# Patient Record
Sex: Female | Born: 1965 | Race: Black or African American | Hispanic: No | Marital: Single | Smoking: Current every day smoker
Health system: Southern US, Community
[De-identification: ages and names within clinical notes are randomized; demographics above are authoritative.]

---

## 1998-07-22 ENCOUNTER — Other Ambulatory Visit: Admission: RE | Admit: 1998-07-22 | Discharge: 1998-07-22 | Payer: Self-pay | Admitting: Gynecology

## 2003-11-07 ENCOUNTER — Inpatient Hospital Stay (HOSPITAL_COMMUNITY): Admission: AD | Admit: 2003-11-07 | Discharge: 2003-11-07 | Payer: Self-pay | Admitting: Obstetrics & Gynecology

## 2003-11-09 ENCOUNTER — Inpatient Hospital Stay (HOSPITAL_COMMUNITY): Admission: AD | Admit: 2003-11-09 | Discharge: 2003-11-09 | Payer: Self-pay | Admitting: Family Medicine

## 2003-11-11 ENCOUNTER — Other Ambulatory Visit: Admission: RE | Admit: 2003-11-11 | Discharge: 2003-11-11 | Payer: Self-pay | Admitting: Obstetrics and Gynecology

## 2003-11-17 ENCOUNTER — Ambulatory Visit (HOSPITAL_COMMUNITY): Admission: RE | Admit: 2003-11-17 | Discharge: 2003-11-17 | Payer: Self-pay | Admitting: *Deleted

## 2003-11-28 ENCOUNTER — Inpatient Hospital Stay (HOSPITAL_COMMUNITY): Admission: AD | Admit: 2003-11-28 | Discharge: 2003-11-28 | Payer: Self-pay | Admitting: Obstetrics & Gynecology

## 2003-12-01 ENCOUNTER — Encounter (INDEPENDENT_AMBULATORY_CARE_PROVIDER_SITE_OTHER): Payer: Self-pay | Admitting: *Deleted

## 2003-12-01 ENCOUNTER — Ambulatory Visit (HOSPITAL_COMMUNITY): Admission: AD | Admit: 2003-12-01 | Discharge: 2003-12-01 | Payer: Self-pay | Admitting: Obstetrics & Gynecology

## 2004-01-15 ENCOUNTER — Ambulatory Visit: Payer: Self-pay | Admitting: Psychiatry

## 2004-01-15 ENCOUNTER — Inpatient Hospital Stay (HOSPITAL_COMMUNITY): Admission: RE | Admit: 2004-01-15 | Discharge: 2004-01-20 | Payer: Self-pay | Admitting: Psychiatry

## 2004-04-08 ENCOUNTER — Inpatient Hospital Stay (HOSPITAL_COMMUNITY): Admission: AD | Admit: 2004-04-08 | Discharge: 2004-04-08 | Payer: Self-pay | Admitting: Obstetrics & Gynecology

## 2004-06-21 ENCOUNTER — Inpatient Hospital Stay (HOSPITAL_COMMUNITY): Admission: AD | Admit: 2004-06-21 | Discharge: 2004-06-21 | Payer: Self-pay | Admitting: Obstetrics & Gynecology

## 2004-08-08 ENCOUNTER — Inpatient Hospital Stay (HOSPITAL_COMMUNITY): Admission: AD | Admit: 2004-08-08 | Discharge: 2004-08-08 | Payer: Self-pay | Admitting: Obstetrics & Gynecology

## 2004-09-12 ENCOUNTER — Inpatient Hospital Stay (HOSPITAL_COMMUNITY): Admission: AD | Admit: 2004-09-12 | Discharge: 2004-09-12 | Payer: Self-pay | Admitting: Obstetrics

## 2004-09-29 ENCOUNTER — Inpatient Hospital Stay (HOSPITAL_COMMUNITY): Admission: AD | Admit: 2004-09-29 | Discharge: 2004-10-02 | Payer: Self-pay | Admitting: Obstetrics

## 2004-09-30 ENCOUNTER — Encounter (INDEPENDENT_AMBULATORY_CARE_PROVIDER_SITE_OTHER): Payer: Self-pay | Admitting: Specialist

## 2004-11-17 ENCOUNTER — Ambulatory Visit (HOSPITAL_COMMUNITY): Admission: RE | Admit: 2004-11-17 | Discharge: 2004-11-17 | Payer: Self-pay | Admitting: Obstetrics & Gynecology

## 2005-04-30 IMAGING — US US OB TRANSVAGINAL
1 series · 16 of 16 positions shown · non-contrast
Comparison: none

CLINICAL DATA: Reassess for viability.
TRANSVAGINAL OBSTETRICAL ULTRASOUND:
Correlation is made with the previous exam dated 11/07/03.
Multiple images of the uterus and adnexa were obtained using an endovaginal approach.
There is a single intrauterine pregnancy identified that demonstrates an estimated gestational age by crown rump length of 7 weeks and 3 days.  No evidence for fetal cardiac activity was discerned by two independent observers for a total period of 3 minutes and should be easily recognizable at this point in gestation.  Findings are confirmatory of an intrauterine fetal demise. 
The left ovary has a normal appearance measuring 2.9 x 1.0 x 1.4 cm.  The right ovary contains a complex cyst with diffuse high attenuation material and demonstrating posterior acoustical shadowing measuring 6.7 x 5.8 x 5.9 cm.  This is contiguous with more normal appearing right ovarian tissue and would be compatible with an ovarian dermoid.  No pelvic fluid is seen.
IMPRESSION
Findings compatible with a 7 week 3 days intrauterine fetal demise.  Normal left ovary.  Right ovarian dermoid.

[Series 1: us ob transvaginal · 16 of 16 slices shown]
[im 1/16]
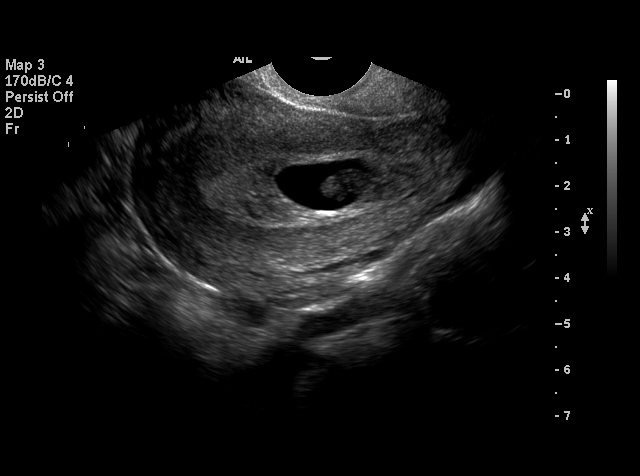
[im 2/16]
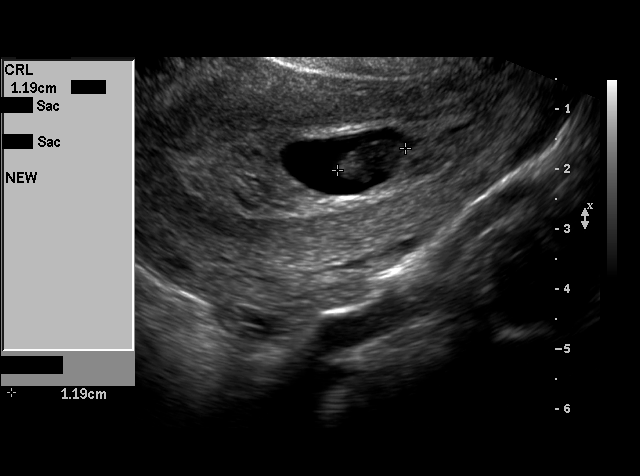
[im 3/16]
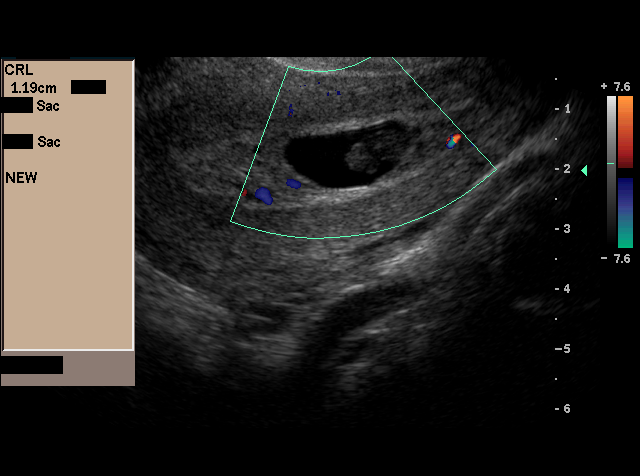
[im 4/16]
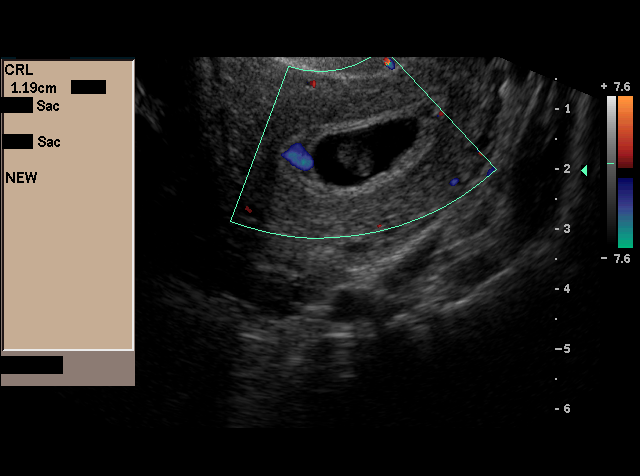
[im 5/16]
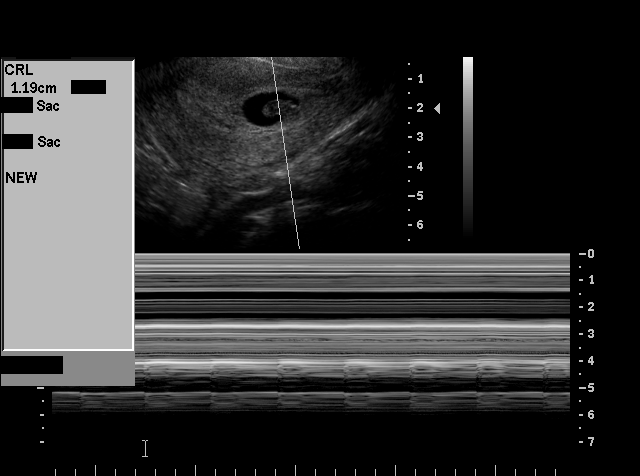
[im 6/16]
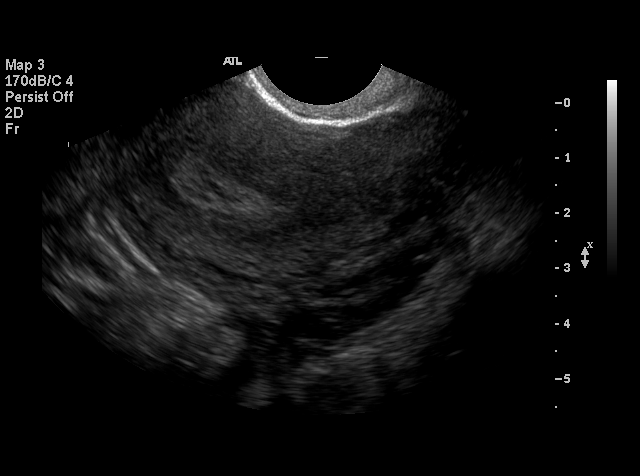
[im 7/16]
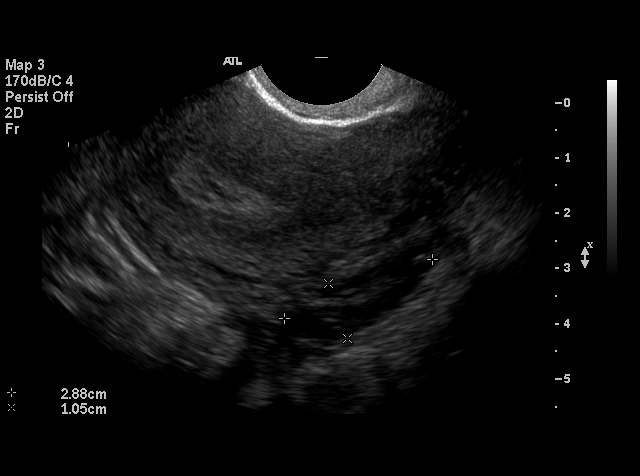
[im 8/16]
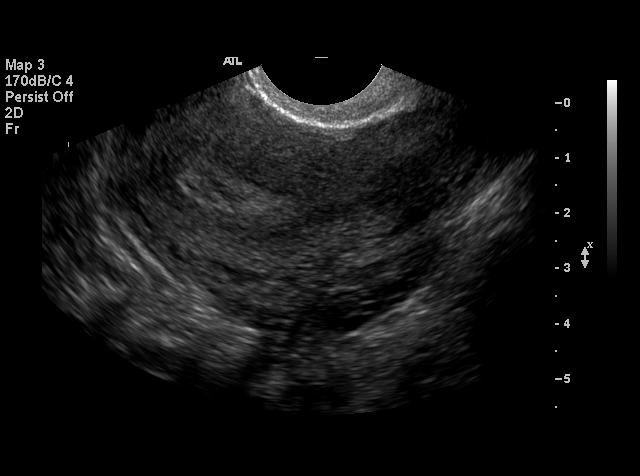
[im 9/16]
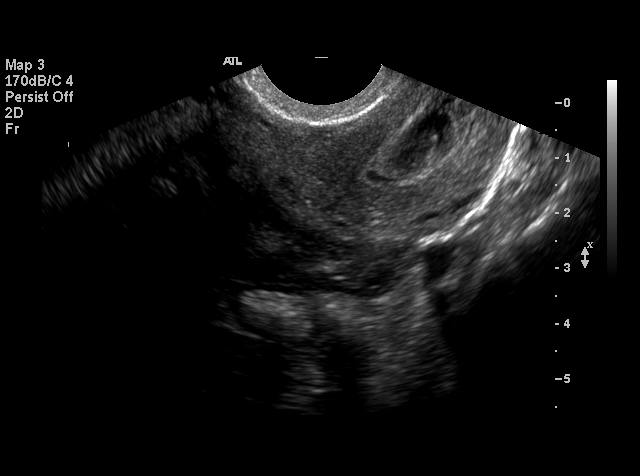
[im 10/16]
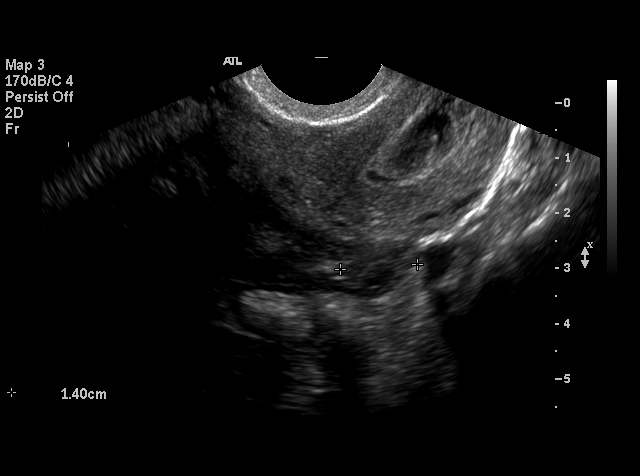
[im 11/16]
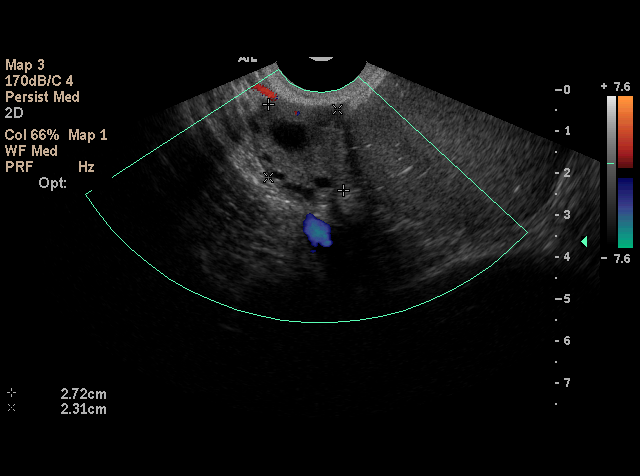
[im 12/16]
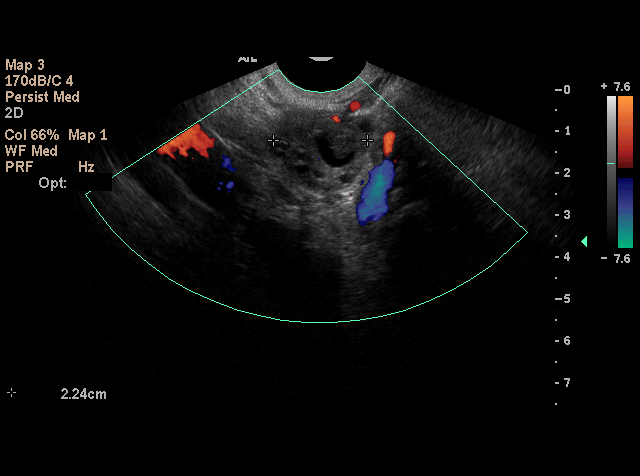
[im 13/16]
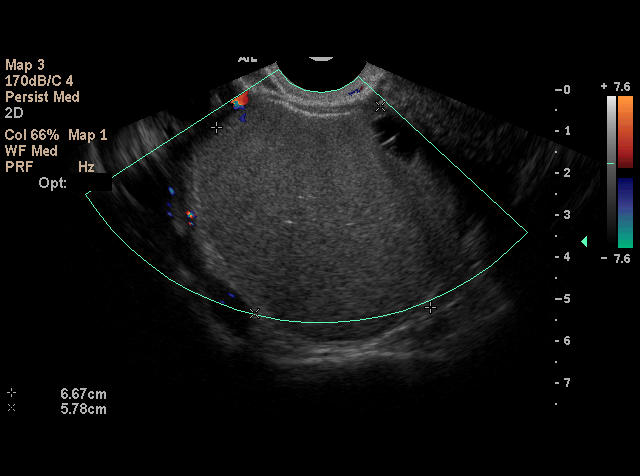
[im 14/16]
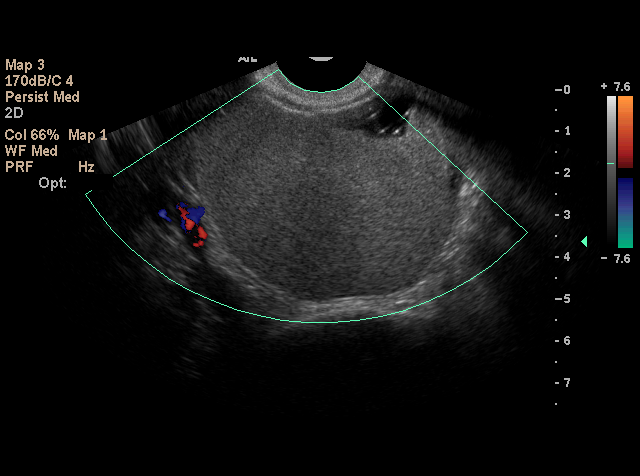
[im 15/16]
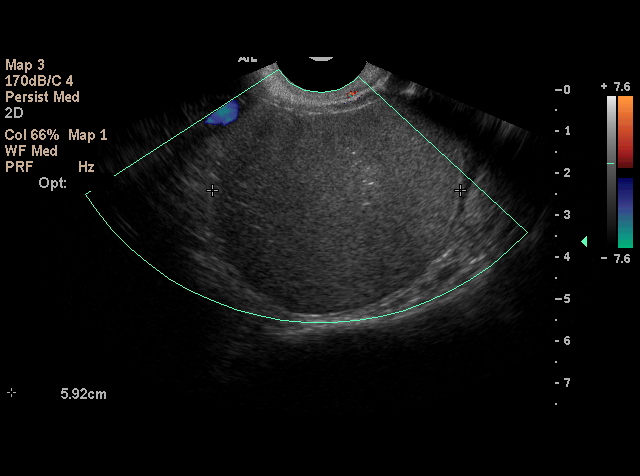
[im 16/16]
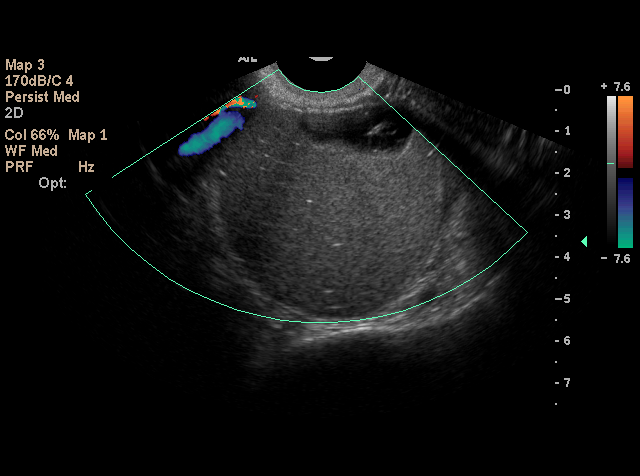

[16 of 16 positions shown; findings below may reference images not displayed]

## 2005-09-28 ENCOUNTER — Inpatient Hospital Stay (HOSPITAL_COMMUNITY): Admission: AD | Admit: 2005-09-28 | Discharge: 2005-09-28 | Payer: Self-pay | Admitting: Gynecology

## 2006-01-11 ENCOUNTER — Inpatient Hospital Stay (HOSPITAL_COMMUNITY): Admission: AD | Admit: 2006-01-11 | Discharge: 2006-01-11 | Payer: Self-pay | Admitting: Obstetrics and Gynecology

## 2006-04-27 ENCOUNTER — Inpatient Hospital Stay (HOSPITAL_COMMUNITY): Admission: AD | Admit: 2006-04-27 | Discharge: 2006-04-27 | Payer: Self-pay | Admitting: Obstetrics and Gynecology

## 2006-05-11 ENCOUNTER — Inpatient Hospital Stay (HOSPITAL_COMMUNITY): Admission: AD | Admit: 2006-05-11 | Discharge: 2006-05-11 | Payer: Self-pay | Admitting: Obstetrics & Gynecology

## 2006-05-17 ENCOUNTER — Inpatient Hospital Stay (HOSPITAL_COMMUNITY): Admission: AD | Admit: 2006-05-17 | Discharge: 2006-05-17 | Payer: Self-pay | Admitting: Obstetrics and Gynecology

## 2006-05-23 ENCOUNTER — Inpatient Hospital Stay (HOSPITAL_COMMUNITY): Admission: AD | Admit: 2006-05-23 | Discharge: 2006-05-26 | Payer: Self-pay | Admitting: Obstetrics and Gynecology

## 2006-05-23 ENCOUNTER — Encounter (INDEPENDENT_AMBULATORY_CARE_PROVIDER_SITE_OTHER): Payer: Self-pay | Admitting: Specialist

## 2007-07-26 ENCOUNTER — Encounter: Admission: RE | Admit: 2007-07-26 | Discharge: 2007-07-26 | Payer: Self-pay | Admitting: Nephrology

## 2007-11-21 ENCOUNTER — Inpatient Hospital Stay (HOSPITAL_COMMUNITY): Admission: AD | Admit: 2007-11-21 | Discharge: 2007-11-21 | Payer: Self-pay | Admitting: Obstetrics and Gynecology

## 2008-07-05 ENCOUNTER — Other Ambulatory Visit: Payer: Self-pay | Admitting: Emergency Medicine

## 2008-07-06 ENCOUNTER — Inpatient Hospital Stay (HOSPITAL_COMMUNITY): Admission: AD | Admit: 2008-07-06 | Discharge: 2008-07-08 | Payer: Self-pay | Admitting: *Deleted

## 2008-07-06 ENCOUNTER — Ambulatory Visit: Payer: Self-pay | Admitting: *Deleted

## 2009-07-06 ENCOUNTER — Inpatient Hospital Stay (HOSPITAL_COMMUNITY): Admission: RE | Admit: 2009-07-06 | Discharge: 2009-07-09 | Payer: Self-pay | Admitting: Psychiatry

## 2009-07-06 ENCOUNTER — Other Ambulatory Visit: Payer: Self-pay | Admitting: Emergency Medicine

## 2009-07-06 ENCOUNTER — Ambulatory Visit: Payer: Self-pay | Admitting: Psychiatry

## 2009-12-23 ENCOUNTER — Inpatient Hospital Stay (HOSPITAL_COMMUNITY): Admission: AD | Admit: 2009-12-23 | Discharge: 2009-12-23 | Payer: Self-pay | Admitting: Obstetrics and Gynecology

## 2009-12-23 DIAGNOSIS — R1032 Left lower quadrant pain: Secondary | ICD-10-CM

## 2009-12-23 DIAGNOSIS — O9989 Other specified diseases and conditions complicating pregnancy, childbirth and the puerperium: Secondary | ICD-10-CM

## 2009-12-23 DIAGNOSIS — O99891 Other specified diseases and conditions complicating pregnancy: Secondary | ICD-10-CM

## 2010-02-01 ENCOUNTER — Ambulatory Visit (HOSPITAL_COMMUNITY): Admission: RE | Admit: 2010-02-01 | Discharge: 2010-02-01 | Payer: Self-pay | Admitting: Obstetrics and Gynecology

## 2010-03-08 ENCOUNTER — Ambulatory Visit (HOSPITAL_COMMUNITY): Admission: RE | Admit: 2010-03-08 | Discharge: 2010-03-08 | Payer: Self-pay | Admitting: Obstetrics and Gynecology

## 2010-03-18 ENCOUNTER — Inpatient Hospital Stay (HOSPITAL_COMMUNITY): Admission: AD | Admit: 2010-03-18 | Discharge: 2010-03-18 | Payer: Self-pay | Admitting: Obstetrics & Gynecology

## 2010-04-05 ENCOUNTER — Ambulatory Visit (HOSPITAL_COMMUNITY)
Admission: RE | Admit: 2010-04-05 | Discharge: 2010-04-05 | Payer: Self-pay | Source: Home / Self Care | Attending: Obstetrics and Gynecology | Admitting: Obstetrics and Gynecology

## 2010-04-27 ENCOUNTER — Inpatient Hospital Stay (HOSPITAL_COMMUNITY)
Admission: AD | Admit: 2010-04-27 | Discharge: 2010-04-27 | Payer: Self-pay | Source: Home / Self Care | Attending: Obstetrics and Gynecology | Admitting: Obstetrics and Gynecology

## 2010-07-06 LAB — URINALYSIS, ROUTINE W REFLEX MICROSCOPIC
Bilirubin Urine: NEGATIVE
Glucose, UA: NEGATIVE mg/dL
Ketones, ur: NEGATIVE mg/dL
Specific Gravity, Urine: >1.03 — ABNORMAL HIGH (ref 1.005–1.030)
pH: 6 (ref 5.0–8.0)

## 2010-07-06 LAB — URINE MICROSCOPIC-ADD ON

## 2010-07-08 LAB — URINALYSIS, ROUTINE W REFLEX MICROSCOPIC
Ketones, ur: NEGATIVE mg/dL
Nitrite: NEGATIVE
Specific Gravity, Urine: 1.01 (ref 1.005–1.030)
Urobilinogen, UA: 0.2 mg/dL (ref 0.0–1.0)
pH: 6.5 (ref 5.0–8.0)

## 2010-07-08 LAB — WET PREP, GENITAL
Trich, Wet Prep: NONE SEEN
Yeast Wet Prep HPF POC: NONE SEEN

## 2010-07-08 LAB — GC/CHLAMYDIA PROBE AMP, GENITAL: Chlamydia, DNA Probe: NEGATIVE

## 2010-07-19 LAB — CBC
HCT: 39.6 % (ref 36.0–46.0)
Hemoglobin: 13.3 g/dL (ref 12.0–15.0)
MCHC: 33.5 g/dL (ref 30.0–36.0)
MCV: 87.8 fL (ref 78.0–100.0)
Platelets: 264 K/uL (ref 150–400)
RBC: 4.51 MIL/uL (ref 3.87–5.11)
RDW: 14.8 % (ref 11.5–15.5)
WBC: 14.7 K/uL — ABNORMAL HIGH (ref 4.0–10.5)

## 2010-07-19 LAB — ETHANOL: Alcohol, Ethyl (B): <5 mg/dL (ref 0–10)

## 2010-07-19 LAB — RAPID URINE DRUG SCREEN, HOSP PERFORMED
Amphetamines: NOT DETECTED
Barbiturates: NOT DETECTED
Benzodiazepines: NOT DETECTED
Cocaine: NOT DETECTED
Opiates: NOT DETECTED
Tetrahydrocannabinol: POSITIVE — AB

## 2010-07-19 LAB — DIFFERENTIAL
Basophils Absolute: 0 10*3/uL (ref 0.0–0.1)
Basophils Relative: 0 % (ref 0–1)
Eosinophils Absolute: 0 10*3/uL (ref 0.0–0.7)
Eosinophils Relative: 0 % (ref 0–5)
Lymphocytes Relative: 9 % — ABNORMAL LOW (ref 12–46)
Lymphs Abs: 1.3 K/uL (ref 0.7–4.0)
Monocytes Absolute: 0.8 10*3/uL (ref 0.1–1.0)
Monocytes Relative: 5 % (ref 3–12)
Neutro Abs: 12.6 K/uL — ABNORMAL HIGH (ref 1.7–7.7)
Neutrophils Relative %: 86 % — ABNORMAL HIGH (ref 43–77)

## 2010-07-19 LAB — BASIC METABOLIC PANEL WITH GFR
BUN: 3 mg/dL — ABNORMAL LOW (ref 6–23)
Creatinine, Ser: 0.81 mg/dL (ref 0.4–1.2)
GFR calc non Af Amer: >60 mL/min (ref >60)

## 2010-07-19 LAB — BASIC METABOLIC PANEL
CO2: 22 mEq/L (ref 19–32)
Calcium: 9.2 mg/dL (ref 8.4–10.5)
Chloride: 108 mEq/L (ref 96–112)
GFR calc Af Amer: >60 mL/min (ref >60)
Glucose, Bld: 102 mg/dL — ABNORMAL HIGH (ref 70–99)
Potassium: 3.1 mEq/L — ABNORMAL LOW (ref 3.5–5.1)
Sodium: 139 mEq/L (ref 135–145)

## 2010-07-19 LAB — TRICYCLICS SCREEN, URINE: TCA Scrn: NOT DETECTED

## 2010-07-23 ENCOUNTER — Inpatient Hospital Stay (HOSPITAL_COMMUNITY)
Admission: AD | Admit: 2010-07-23 | Discharge: 2010-07-23 | Disposition: A | Discharge status: Home / Self Care | Payer: Medicare Other | Source: Ambulatory Visit | Attending: Obstetrics and Gynecology | Admitting: Obstetrics and Gynecology

## 2010-07-23 DIAGNOSIS — O99891 Other specified diseases and conditions complicating pregnancy: Secondary | ICD-10-CM | POA: Insufficient documentation

## 2010-07-23 DIAGNOSIS — R109 Unspecified abdominal pain: Secondary | ICD-10-CM | POA: Insufficient documentation

## 2010-07-23 DIAGNOSIS — O9989 Other specified diseases and conditions complicating pregnancy, childbirth and the puerperium: Secondary | ICD-10-CM

## 2010-07-28 ENCOUNTER — Encounter (HOSPITAL_COMMUNITY)
Admission: RE | Admit: 2010-07-28 | Discharge: 2010-07-28 | Disposition: A | Discharge status: Home / Self Care | Payer: Medicare Other | Source: Ambulatory Visit | Attending: Obstetrics and Gynecology | Admitting: Obstetrics and Gynecology

## 2010-07-28 ENCOUNTER — Inpatient Hospital Stay (HOSPITAL_COMMUNITY)
Admission: AD | Admit: 2010-07-28 | Discharge: 2010-07-28 | Disposition: A | Discharge status: Home / Self Care | Payer: Medicare Other | Source: Ambulatory Visit | Attending: Obstetrics and Gynecology | Admitting: Obstetrics and Gynecology

## 2010-07-28 ENCOUNTER — Inpatient Hospital Stay (HOSPITAL_COMMUNITY)
Admission: AD | Admit: 2010-07-28 | Discharge: 2010-07-29 | Disposition: A | Discharge status: Home / Self Care | Payer: Medicare Other | Source: Ambulatory Visit | Attending: Obstetrics and Gynecology | Admitting: Obstetrics and Gynecology

## 2010-07-28 DIAGNOSIS — Z331 Pregnant state, incidental: Secondary | ICD-10-CM

## 2010-07-28 DIAGNOSIS — O99891 Other specified diseases and conditions complicating pregnancy: Secondary | ICD-10-CM | POA: Insufficient documentation

## 2010-07-28 DIAGNOSIS — R7989 Other specified abnormal findings of blood chemistry: Secondary | ICD-10-CM

## 2010-07-28 DIAGNOSIS — R51 Headache: Secondary | ICD-10-CM | POA: Insufficient documentation

## 2010-07-28 LAB — CBC
Platelets: 259 10*3/uL (ref 150–400)
RDW: 15.6 % — ABNORMAL HIGH (ref 11.5–15.5)
WBC: 11.1 10*3/uL — ABNORMAL HIGH (ref 4.0–10.5)

## 2010-07-28 LAB — URINALYSIS, ROUTINE W REFLEX MICROSCOPIC
Ketones, ur: NEGATIVE mg/dL
Nitrite: NEGATIVE
Specific Gravity, Urine: >1.03 — ABNORMAL HIGH (ref 1.005–1.030)
Urobilinogen, UA: 0.2 mg/dL (ref 0.0–1.0)
pH: 6 (ref 5.0–8.0)

## 2010-07-28 LAB — COMPREHENSIVE METABOLIC PANEL
ALT: 35 U/L (ref 0–35)
Albumin: 2.5 g/dL — ABNORMAL LOW (ref 3.5–5.2)
Alkaline Phosphatase: 98 U/L (ref 39–117)
Calcium: 9.3 mg/dL (ref 8.4–10.5)
GFR calc Af Amer: >60 mL/min (ref >60)
Potassium: 4.5 mEq/L (ref 3.5–5.1)
Sodium: 133 mEq/L — ABNORMAL LOW (ref 135–145)
Total Protein: 5.9 g/dL — ABNORMAL LOW (ref 6.0–8.3)

## 2010-07-28 LAB — DIFFERENTIAL
Basophils Absolute: 0 10*3/uL (ref 0.0–0.1)
Basophils Relative: 0 % (ref 0–1)
Eosinophils Absolute: 0.1 10*3/uL (ref 0.0–0.7)
Eosinophils Relative: 0 % (ref 0–5)
Lymphocytes Relative: 23 % (ref 12–46)

## 2010-07-28 LAB — SURGICAL PCR SCREEN
MRSA, PCR: NEGATIVE
Staphylococcus aureus: NEGATIVE

## 2010-07-28 LAB — PROTIME-INR: INR: 0.93 (ref 0.00–1.49)

## 2010-07-28 LAB — APTT: aPTT: 29 seconds (ref 24–37)

## 2010-07-29 ENCOUNTER — Inpatient Hospital Stay (HOSPITAL_COMMUNITY)
Admission: AD | Admit: 2010-07-29 | Discharge: 2010-07-29 | Disposition: A | Discharge status: Home / Self Care | Payer: Medicare Other | Source: Ambulatory Visit | Attending: Obstetrics and Gynecology | Admitting: Obstetrics and Gynecology

## 2010-07-29 DIAGNOSIS — O99810 Abnormal glucose complicating pregnancy: Secondary | ICD-10-CM | POA: Insufficient documentation

## 2010-07-29 LAB — URINALYSIS, ROUTINE W REFLEX MICROSCOPIC
Glucose, UA: NEGATIVE mg/dL
Hgb urine dipstick: NEGATIVE
Protein, ur: NEGATIVE mg/dL
pH: 6 (ref 5.0–8.0)

## 2010-07-29 LAB — COMPREHENSIVE METABOLIC PANEL
ALT: 34 U/L (ref 0–35)
AST: 26 U/L (ref 0–37)
Albumin: 2.5 g/dL — ABNORMAL LOW (ref 3.5–5.2)
Alkaline Phosphatase: 97 U/L (ref 39–117)
CO2: 19 mEq/L (ref 19–32)
Chloride: 105 mEq/L (ref 96–112)
Creatinine, Ser: 0.73 mg/dL (ref 0.4–1.2)
GFR calc Af Amer: >60 mL/min (ref >60)
GFR calc non Af Amer: >60 mL/min (ref >60)
Potassium: 3.9 mEq/L (ref 3.5–5.1)
Sodium: 132 mEq/L — ABNORMAL LOW (ref 135–145)
Total Bilirubin: 0.4 mg/dL (ref 0.3–1.2)

## 2010-07-29 LAB — HEMOGLOBIN A1C: Hgb A1c MFr Bld: 7.1 % — ABNORMAL HIGH (ref <5.7)

## 2010-07-30 ENCOUNTER — Inpatient Hospital Stay (HOSPITAL_COMMUNITY)
Admission: RE | Admit: 2010-07-30 | Payer: Medicaid Other | Source: Ambulatory Visit | Admitting: Obstetrics & Gynecology

## 2010-08-03 ENCOUNTER — Other Ambulatory Visit: Payer: Self-pay | Admitting: Obstetrics and Gynecology

## 2010-08-03 ENCOUNTER — Inpatient Hospital Stay (HOSPITAL_COMMUNITY)
Admission: RE | Admit: 2010-08-03 | Discharge: 2010-08-06 | DRG: 766 | Disposition: A | Discharge status: Home / Self Care | Payer: Medicare Other | Source: Ambulatory Visit | Attending: Obstetrics and Gynecology | Admitting: Obstetrics and Gynecology

## 2010-08-03 DIAGNOSIS — D279 Benign neoplasm of unspecified ovary: Secondary | ICD-10-CM | POA: Diagnosis present

## 2010-08-03 DIAGNOSIS — O34219 Maternal care for unspecified type scar from previous cesarean delivery: Principal | ICD-10-CM | POA: Diagnosis present

## 2010-08-03 DIAGNOSIS — O34599 Maternal care for other abnormalities of gravid uterus, unspecified trimester: Secondary | ICD-10-CM | POA: Diagnosis present

## 2010-08-03 DIAGNOSIS — O09529 Supervision of elderly multigravida, unspecified trimester: Secondary | ICD-10-CM | POA: Diagnosis present

## 2010-08-03 LAB — TYPE AND SCREEN

## 2010-08-04 LAB — CCBB MATERNAL DONOR DRAW

## 2010-08-04 LAB — CBC
HCT: 35.1 % — ABNORMAL LOW (ref 36.0–46.0)
Hemoglobin: 11.4 g/dL — ABNORMAL LOW (ref 12.0–15.0)
MCV: 85 fL (ref 78.0–100.0)
RBC: 4.13 MIL/uL (ref 3.87–5.11)
RDW: 15.9 % — ABNORMAL HIGH (ref 11.5–15.5)
WBC: 9.5 10*3/uL (ref 4.0–10.5)

## 2010-08-05 LAB — RAPID URINE DRUG SCREEN, HOSP PERFORMED
Benzodiazepines: NOT DETECTED
Cocaine: NOT DETECTED
Opiates: NOT DETECTED
Tetrahydrocannabinol: POSITIVE — AB

## 2010-08-05 LAB — CBC
HCT: 41.2 % (ref 36.0–46.0)
MCHC: 33 g/dL (ref 30.0–36.0)
MCV: 87.2 fL (ref 78.0–100.0)
RBC: 4.72 MIL/uL (ref 3.87–5.11)
WBC: 10 10*3/uL (ref 4.0–10.5)

## 2010-08-05 LAB — BASIC METABOLIC PANEL
BUN: 5 mg/dL — ABNORMAL LOW (ref 6–23)
CO2: 22 mEq/L (ref 19–32)
Chloride: 109 mEq/L (ref 96–112)
Potassium: 3.7 mEq/L (ref 3.5–5.1)

## 2010-08-05 LAB — TRICYCLICS SCREEN, URINE: TCA Scrn: NOT DETECTED

## 2010-08-05 LAB — DIFFERENTIAL
Basophils Relative: 1 % (ref 0–1)
Eosinophils Absolute: 0.1 10*3/uL (ref 0.0–0.7)
Eosinophils Relative: 1 % (ref 0–5)
Lymphs Abs: 2.5 10*3/uL (ref 0.7–4.0)
Monocytes Relative: 6 % (ref 3–12)

## 2010-08-05 LAB — ETHANOL: Alcohol, Ethyl (B): <5 mg/dL (ref 0–10)

## 2010-09-07 NOTE — H&P (Signed)
NAMEDESARAY, MARSCHNER NO.:  [id number]   MEDICAL RECORD NO.:  [id number]          PATIENT TYPE:  IPS   LOCATION:  0403                          [facility]ACILITY:  [facility]   PHYSICIAN:  Anselm Jungling, MD  DATE OF BIRTH:  1965-11-10   DATE OF ADMISSION:  07/06/2008  DATE OF DISCHARGE:                       PSYCHIATRIC ADMISSION ASSESSMENT   IDENTIFYING INFORMATION:  This is a 42-year female voluntarily admitted  on July 06, 2008.   HISTORY OF PRESENT ILLNESS:  The patient is here after an intentional  overdose on Zoloft, taking an unknown quantity.  She states she was  hoping to die and not be here.  She was found per her sister.  The  patient reports feeling very overwhelmed but did not disclose the nature  of her stressors.  She denies any substance use and offers no other  details.   PAST PSYCHIATRIC HISTORY:  The patient was here in 2005.  No current  outpatient mental health treatment.   SOCIAL HISTORY:  The patient lives in [place], reporting having two  children ages to 2 and 3.  Whereabouts of the children at this time are  unknown.   FAMILY HISTORY:  Unknown.   ALCOHOL AND DRUG HISTORY:  The patient denies any alcohol or drug use.   PRIMARY CARE Blaine Guiffre:  Fleet Contras, M.D.   MEDICAL PROBLEMS:  She denies any chronic or acute health problems.   MEDICATIONS LISTED:  1. Zoloft 50 mg daily.  2. Geodon 40 mg daily.  (With the overdose on her Zoloft.)   DRUG ALLERGIES:  No known allergies.   PHYSICAL EXAMINATION:  This is a middle-aged female, fully assessed in  [facility] emergency department with no significant physical findings.  Temperature 96.8, heart rate 77, respirations 20, blood pressure is  119/79.   LABORATORY DATA:  Her laboratory data shows an acetaminophen level less  than 10.  Urine drug screen is positive for THC.  Salicylate less than  4.  Alcohol level less than 5.  Urine pregnancy test is negative.  BMET  within normal limits.   CBC shows a white count of 15.7.   MENTAL STATUS EXAM:  This is a fully alert, cooperative female.  She is  resting in her bed at this time.  She has good eye contact.  She appears  somewhat guarded.  Again, she offers little information.  Her speech is  soft-spoken.  The patient's mood is neutral.  The patient again is  guarded.  Does not appear to be responding to internal stimuli.  She is  a poor historian.  Her insight is limited.   IMPRESSION:  AXIS I:  Major depressive disorder.  THC abuse.  AXIS II:  Deferred.  AXIS III:  No known medical conditions.  AXIS IV:  Deferred at this time.  AXIS V:  Current is 30-35.   PLAN:  Our plan is to contract for safety.  Will monitor the patient on  the intensive unit.  Will stabilize her mood and thinking and continue  to gather more history, identify the patient's stressors.  We will  reinforce medication compliance.  Will address her substance use.  We  will identify her support group and have a family session.  Case manager  will assess her follow up.  Her tentative length of stay at this time is  3-5 days.      Landry Corporal, N.P.      Anselm Jungling, MD  Electronically Signed    JO/MEDQ  D:  07/08/2008  T:  07/08/2008  Job:  [id number]

## 2010-09-08 NOTE — Op Note (Signed)
NAMETREZURE, CRONK                ACCOUNT NO.:  [id number]  MEDICAL RECORD NO.:  [id number]           PATIENT TYPE:  I  LOCATION:  9313                          [facility]  [facility]  PHYSICIAN:  Carrington Clamp, M.D. DATE OF BIRTH:  06-Dec-1965  DATE OF PROCEDURE:  08/03/2010 DATE OF DISCHARGE:                              OPERATIVE REPORT   PREOPERATIVE DIAGNOSES: 1. Repeat cesarean section #3. 2. Dermoid of left ovaries.  POSTOPERATIVE DIAGNOSES: 1. Repeat cesarean section #3. 2. Dermoid of left ovaries.  PROCEDURES: 1. Repeat low-transverse cesarean section. 2. Left ovarian cystectomy.  SURGEON:  Carrington Clamp, MD  ASSISTANT:  Leilani Able, PA-C  ANESTHESIA:  Spinal.  FINDINGS:  A female infant, vertex presentation, Apgars 8 and 9, weight is pending.  Normal tubes and uterus seen, 7-8 cm dermoid cyst seen on the left ovary almost pedunculated off it with no outer excrescences or adhesions.  SPECIMENS:  Left ovarian cyst to Pathology.  ESTIMATED BLOOD LOSS:  700 mL.  IV FLUIDS:  2000 mL.  URINE OUTPUT:  150 mL.  COMPLICATIONS:  Baby to NICU for low blood sugar and low sats.  COUNTS:  Correct x3.  REASON FOR OPERATION:  Ms. [name] is a 45 year old for repeat cesarean section x3.  The patient had passed her 1-hour sugar test without any complication at 26 weeks.  At 33 weeks, an ultrasound of the baby had been done for decreased fetal movement and EFW was found to be 46th percentile.  However, when the patient appeared at [facility] for decreased fetal movements on July 29, 2010, her blood sugar was incidentally found to be 258.  The patient's blood sugar returned to relatively normal levels within a short period of time.  Her hemoglobin A1c at that time was done and it was 7.1.  Because this was only 5 days before the planned cesarean section, she was offered an admission to [facility] on July 30, 2010, to do aggressive  sugar management and the patient left AMA.  The patient understood that there was potential for the baby to have some low blood sugars and she went home with instructions for a diet.  The patient assured me that she was doing a low-carb diet.  Her fasting blood sugar this morning was 121. The surgery was then performed as planned.  TECHNIQUE:  After adequate spinal anesthesia was achieved, the patient was prepped and draped in usual sterile fashion in dorsal supine position with leftward tilt.  A Pfannenstiel skin incision was made with a scalpel, carried down to the fascia with Bovie cautery.  The fascia was incised in the midline and carried in a transverse curvilinear manner with the Mayo scissors.  The fascia was reflected superior inferior from the rectus muscle and the rectus muscle was split in the midline.  The bowel free portion of peritoneum was then entered into bluntly and omentum that had been stuck to the anterior peritoneum was retracted out of the incision with Bovie cautery along the peritoneum. The [employer] was then placed.  The vesicouterine fascia was tented up and incised in a transverse curvilinear  manner.  Bladder flap was created with blunt dissection retracting the bladder inferiorly.  A 2-cm incision was made in the upper portion of lower uterine segment until amnion was noted.  The uterine incision was extended manually in transverse curvilinear manner and also with the assistance of the bandage scissors.  The amnion was ruptured and light meconium was noted.  The baby was identified in the vertex presentation and delivered without complication.  The cord was clamped and cut and baby was handed to awaiting pediatrics.  The placenta was then delivered manually and the uterus cleared of all debris.  The uterine incision was fairly thick at this point as the uterus had been from malrotated because of the dermoid.  Two layers of 0- Monocryl in a running  lock stitch were then used to close the uterine incision.  The final layer was then closed with an imbricating layer of 0-Monocryl.  Hemostasis was achieved and attention was then turned to the left ovary.  The ovary was brought into the surgical field and noted to have a 3-cm pedicle on what appeared to be otherwise normal left ovary.  Two Kelly's were placed on this pedicle and the dermoid excised with the Mayo scissors.  A running stitch of 0-Vicryl was then used to close the pedicle bed of the ovary.  Hemostasis was achieved on the edge of the tube with Bovie cautery.  Once hemostasis was achieved of the ovary, the other ovary was inspected and found to be essentially normal except for some adhesions of the fimbria around it.  These were left in situ.  The uterine incision was then reinspected and found to be hemostatic as well as the left ovary and tube.  Irrigation was performed and [employer] removed.  All instruments withdrawn from the abdomen and 3 Kelly's were used to assist in the closing of the peritoneum.  This was closed with a running fashion and then incorporated the rectus muscles as a separate layer. The rectus muscles were rendered hemostatic and the fascia was then closed with a running stitch of 0-Vicryl.  The subcutaneous tissues rendered hemostatic with Bovie cautery and irrigation.  This was then closed with three interrupted sutures of 2-0 plain gut.  The skin was closed with staples.  At this point in time, doctor notified us that the baby's blood sugars were particularly low as well as having some lower sats; and because of that, they did not feel comfortable feeding the baby orally and therefore took the baby up to the NICU for IV glucose and monitoring of breathing.     Carrington Clamp, M.D.     MH/MEDQ  D:  08/03/2010  T:  08/04/2010  Job:  [id number]  Electronically Signed by Carrington Clamp MD on 09/08/2010 02:54:52 PM

## 2010-09-10 NOTE — Op Note (Signed)
[name], [name]                ACCOUNT NO.:  [id number]   MEDICAL RECORD NO.:  [id number]          PATIENT TYPE:  INP   LOCATION:  9117                          [facility]ILITY:  [facility]   PHYSICIAN:  Roseanna Rainbow, M.D.DATE OF BIRTH:  January 08, 1966   DATE OF PROCEDURE:  09/30/2004  DATE OF DISCHARGE:                                 OPERATIVE REPORT   PREOPERATIVE DIAGNOSIS:  Prolonged fetal heart rate deceleration.   POSTOPERATIVE DIAGNOSIS:  Prolonged fetal heart rate deceleration.   PROCEDURE:  Primary low uterine flap elliptical cesarean delivery via  Pfannenstiel skin incision.   SURGEON:  Roseanna Rainbow, M.D.   ANESTHESIA:  Epidural.   ESTIMATED BLOOD LOSS:  600 mL.   COMPLICATIONS:  None.   IV FLUIDS AND URINE OUTPUT:  As per anesthesiology.   PROCEDURE:  The patient was taken to the operating room with an epidural  catheter.  She was prepped rapidly and draped.  A Pfannenstiel incision was  made with a scalpel and carried down to the fascia.  The fascia was incised  along the length of the incision with the scalpel.  The rectus muscles were  separated bluntly in the midline.  The parietal peritoneum was entered  bluntly.  The peritoneal and incision was extended bluntly.  The bladder  blade was then placed.  The vesicouterine peritoneum was tented up and  entered sharply with Metzenbaum scissors.  This incision was then extended  bilaterally and the bladder flap created bluntly.  The lower uterine segment  was then incised in a transverse fashion with the scalpel.  This incision  was then extended bilaterally with bandage scissors.  An initial attempt was  delivered to delivered the infant's head; however, this was limited by the  rectus muscle.  The rectus muscle on the left was then cut medially.  The  infant's head was then delivered atraumatically.  The cord was clamped and  cut.  The infant was handed off to the awaiting neonatologist.  The one-  minute  Apgar was 3, the five-minute Apgar was 9.  The umbilical artery pH  was 6.99.  The placenta was then removed.  The intrauterine cavity was  evacuated of any remaining amniotic fluid, clots and debris with a moist  laparotomy sponge.  The uterine incision was then reapproximated in a  running interlocking fashion with 0 Monocryl.  A second imbricating layer  using a suture of the same was then placed.  Adequate hemostasis was noted.  The paracolic gutters were copiously irrigated.  The parietal peritoneum was  reapproximated in a running fashion with 2-0 Vicryl.  The fascia was  reapproximated in a running fashion with 0 Vicryl.  The skin was  reapproximated with staples.  At the close of procedure,  an abdominal film  was obtained in lieu of an instrument and pack count.  Cefazolin 1 g was  given at cord clamp.  The the patient was taken to the PACU awake and in  stable condition.       LAJ/MEDQ  D:  09/30/2004  T:  10/01/2004  Job:  [id number]

## 2010-09-10 NOTE — Discharge Summary (Signed)
[name], [name]                ACCOUNT NO.:  [id number]   MEDICAL RECORD NO.:  [id number]          PATIENT TYPE:  INP   LOCATION:  9117                          [facility]  [facility]   PHYSICIAN:  [name], M.D.DATE OF BIRTH:  1965-10-04   DATE OF ADMISSION:  09/29/2004  DATE OF DISCHARGE:  10/02/2004                                 DISCHARGE SUMMARY   ADMISSION DIAGNOSES:  [redacted] weeks gestation, intrauterine growth retardation,  induction of labor.   DISCHARGE DIAGNOSES:  [redacted] weeks gestation, intrauterine growth retardation,  induction of labor. Status post primary low transverse cesarean section on  September 30, 2004 for fetal bradycardia. Delivered a viable female infant at 63.  Apgar's of 3 at 1 minute, 7 at 5 minutes, 9 at 10 minutes. Weight of 6  pounds and 1 ounce, length of 50 1.4 cm. Mother and infant discharged home  in good condition.   REASON FOR ADMISSION:  A 45 year old, G2, P0 at [redacted] weeks gestation, history  of IUGR admitted to Labor and Delivery for induction of labor.   PAST MEDICAL HISTORY:  1.  Surgery, D&C.  2.  Illnesses none.   MEDICATIONS:  Prenatal vitamins.   ALLERGIES:  SULFA, BETADINE.   PHYSICAL EXAMINATION:  GENERAL:  Well-nourished, well-developed female in no  acute distress.  VITAL SIGNS:  Afebrile, vital signs are stable.  LUNGS:  Clear to auscultation bilaterally.  HEART:  Regular rate and rhythm.  ABDOMEN:  Gravid, nontender. Cervix 1 cm dilated, 40% effaced and the vertex  at a -2 station. External fetal monitor revealed a fetal heart rate of 130-  140 beats per minute, reactive, no decelerations.   ADMITTING LABORATORY VALUES:  Hemoglobin 12.5, hematocrit 36.3. White blood  cell count 12800, platelets 327,000.   HOSPITAL COURSE:  The patient was admitted and cervical ripening was started  with Cervidil with Pitocin the following morning. The patient then proceeded  to have repetitive moderate to severe variable fetal heart rate  decelerations. At the time, the cervix was 2 cm dilated, 80% effaced. The  patient then had prolonged fetal heart rate deceleration and the patient was  taken to the operating room for an urgent cesarean section for a prolonged  fetal heart rate decelerations. Primary low transverse cesarean section was  performed without complications. Postoperative course was uncomplicated. The  patient was discharged home on postoperative day #2 in good condition.   DISCHARGE LABORATORY VALUES:  Hemoglobin 12.4, hematocrit 37.7, white blood  cell count 12,300, platelets 290,000.   DISPOSITION:  Medications:  Tylox and ibuprofen for pain. Continue prenatal  vitamins. Routine written instructions per booklet were given for  obstetrical discharge after cesarean section. The patient was to call the  office for a followup appointment in two weeks.       CAH/MEDQ  D:  10/28/2004  T:  10/28/2004  Job:  [id number]

## 2010-09-10 NOTE — Op Note (Signed)
[name], [name]                          ACCOUNT NO.:  [id number]   MEDICAL RECORD NO.:  [id number]                   PATIENT TYPE:  MAT   LOCATION:  MATC                                 [facility]   PHYSICIAN:  Roseanna Rainbow, M.D.         DATE OF BIRTH:  04-06-1966   DATE OF PROCEDURE:  12/01/2003  DATE OF DISCHARGE:  11/28/2003                                 OPERATIVE REPORT   PREOPERATIVE DIAGNOSIS:  Embryonic demise.   POSTOPERATIVE DIAGNOSIS:  Embryonic demise.   OPERATION PERFORMED:  Suction dilation and curettage.   SURGEON:  Roseanna Rainbow, M.D.   ANESTHESIA:  Monitored anesthesia care, paracervical block.   ESTIMATED BLOOD LOSS:  50 mL.   COMPLICATIONS:  None.   URINE OUTPUT:  Approximately 100 mL clear urine at the beginning of the  procedure.   FLUIDS REPLACED:  As per anesthesiology.   DESCRIPTION OF PROCEDURE:  The patient was taken to the operating room, she  was placed in the dorsal lithotomy position and prepped and draped in the  usual sterile fashion.  Sims retractors were placed into the vagina.  The  anterior lip of the cervix was infiltrated with 2 mL of 2% lidocaine.  A  single toothed tenaculum was then applied to this location.  10 mL were then  infiltrated at 5 and 7 o'clock to produce a paracervical block.  The cervix  was then dilated with [facility] dilators.  A 7 mm suction curet was then  advanced through the cervix into the uterine cavity.  The curet was  activated and rotated to evacuate the uterus of the products of conception.  A sharp curettage was performed and a gritty texture was noted.  A suction  curet was then reintroduced into the uterine cavity to evacuate any  remaining products of conception.  The single toothed tenaculum was then  removed with minimal bleeding noted from the cervix. At the close of the  procedure, the instrument and pack counts were said to be correct times two.  The patient was taken to the  PACU awake and in stable condition.                                               Roseanna Rainbow, M.D.    Judee Clara  D:  12/01/2003  T:  12/01/2003  Job:  [id number]

## 2010-09-10 NOTE — Discharge Summary (Signed)
[name], [name]                ACCOUNT NO.:  [id number]   MEDICAL RECORD NO.:  [id number]          PATIENT TYPE:  IPS   LOCATION:  0400                          [facility]ILITY[facility]   PHYSICIAN:  Jeanice Lim, M.D. DATE OF BIRTH:  02/15/1966   DATE OF ADMISSION:  01/15/2004  DATE OF DISCHARGE:  01/20/2004                                 DISCHARGE SUMMARY   IDENTIFYING DATA:  This is a 45 year old African American female, single,  voluntarily admitted.  First inpatient psychiatric admission.  Had a  miscarriage about seven weeks ago.  Reported she had fears about her OB/GYN.  Feeling also that someone had been throwing at her house.  She called the  police.  She described increased paranoia.  She had suspicious thoughts,  feeling that she is being watch and that people are making fun of her.  There are possible ideas of reference, believing the TV may talk directly to  her.  She is a poor historian secondary to guardedness.  First psychiatric  admission.  Denies prior treatment, although had been prescribed Zyprexa in  the past, but never took it.   MEDICATIONS:  None.   ALLERGIES:  No known drug allergies.  Food allergy to SHELLFISH.   PHYSICAL EXAMINATION:  Neurologic examination within normal limits.   LABORATORY DATA:  Routine admission laboratories within normal limits.   MENTAL STATUS EXAMINATION:  Guarded.  Tearful affect.  Speech:  Soft tone.  Decreased productivity.  Mood depressed and guarded.  Thought blocking.  Positive ideas of reference and suspiciousness.  Cognitively intact.  Judgment and insight fair to poor due to psychosis.  Not connected entirely  to reality.   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   AXIS II:  Deferred.   AXIS III:  Status post dilatation and curettage.   AXIS IV:  Psychiatric stressors:  Severe, primary support, recent loss of  pregnancy.   AXIS V:  Global assessment of functioning:   18/65.   HOSPITAL COURSE:  The patient was admitted and ordered routine p.r.n.  medications.  She underwent further monitoring.  She was encouraged to  participate in individual and group milieu therapy.  The patient remained  guarded.  Was placed on one-to-one initially and then 15-minute safety  checks.  Given p.r.n. Risperdal and Ativan for agitation, anxiety and  psychosis.  The patient initially was fearful, paranoid, delusion and wanted  out of this place.  She was run to the door and then the window, attempting  to escape.  She continued to be quite delusion and had to be placed on one-  to-one while attempting to stable psychosis, mood and behavior.  Acting on  delusions had been addressed.  The patient gradually became more reality  based and more calm.  She admitted to paranoid thoughts, but denied command  hallucinations.  The patient was discharged in improved condition after a  family meeting.  The family was comfortable and aware that it would take  time for the patient to receive full benefit from medication stabilization  and followup  with supervised as an outpatient.  She had no overt psychotic  symptoms.  Denied paranoid ideation or delusions.  No hallucinations,  including command hallucinations, no suicidal or homicidal ideation and no  side effects from medications, which she was tolerating well, sleeping,  eating and more appropriate on the unit.   DISCHARGE MEDICATIONS:  She was given medication education and discharged  on:  1.  Ativan 0.5 mg one in the morning, one-half at 4 p.m. and two at night.  2.  Depakote ER 500 mg one at night.  3.  Cogentin 0.5 mg three times a day.  4.  Risperdal 1 mg one-half in the morning, one-half at 4 p.m. and three      q.h.s.  5.  Ambien 10 mg q.h.s. p.r.n. insomnia.   FOLLOWUP:  The patient was to follow up at [facility] on Thursday, January 22, 2004, at 10:30 a.m.   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   AXIS II:  Deferred.   AXIS III:  Status post dilatation and curettage.   AXIS IV:  Psychiatric stressors:  Severe, primary support, recent loss of  pregnancy.   AXIS V:  Global assessment of functioning:  __________ on discharge.  The  low was 55.     Jame   JEM/MEDQ  D:  02/22/2004  T:  02/23/2004  Job:  [id number]

## 2010-09-10 NOTE — Op Note (Signed)
[name], [name]                ACCOUNT NO.:  [id number]   MEDICAL RECORD NO.:  [id number]          PATIENT TYPE:  INP   LOCATION:  9111                          FACILITY:  [facility]   PHYSICIAN:  Kendra H. Tenny Craw, MD     DATE OF BIRTH:  December 22, 1965   DATE OF PROCEDURE:  05/23/2006  DATE OF DISCHARGE:  05/17/2006                               OPERATIVE REPORT   PREOPERATIVE DIAGNOSIS:  1. 44 and 0 weeks intrauterine pregnancy.  2. History of prior cesarean section, declined a trial of labor.  3. Non-reactive NST.   POSTOPERATIVE DIAGNOSIS:  1. 44 and 0 weeks intrauterine pregnancy.  2. History of prior cesarean section, declined a trial of labor.  3. Non-reactive NST.   PROCEDURE:  Repeat low transverse cesarean section by Pfannenstiel skin  incision.   SURGEON:  Freddrick March. Tenny Craw, M.D.   ASSISTANT:  Carrington Clamp, M.D.   ESTIMATED BLOOD LOSS:  700 mL.   OPERATIVE FINDINGS:  Vigorous female infant in a vertex presentation  weighing 7 pounds 5 ounces with Apgar scores of 8 and 9.  Normal  appearing ovaries, tubes, and uterus.   DESCRIPTION OF PROCEDURE:  [name] is a 45 year old gravida 3, para 1-  0-1-1, who was scheduled for a repeat C-section on January 31, however,  she presented to the office earlier on January 29 complaining of  decreased fetal movement.  At that time, she was placed on a non-stress  test and was determined to have a reassuring but nonreactive NST.  Given  the fact that she was planned for C-section in two days, the decision  was made to proceed with delivery on January 29.   Following the appropriate informed consent, the patient was brought to  the operating room where spinal anesthesia was administered and found to  be adequate.  She was placed in the dorsal supine position with a  leftward tilt.  The abdomen was prepped and draped in the normal sterile  fashion.  A scalpel was used to make a Pfannenstiel skin incision which  was carried down  through the underlying layers of soft tissue to the  fascia.  The fascia was incised in the midline.  The fascial incision  was extended laterally with Mayo scissors.  The superior aspect of the  fascial incision was grasped with Kocher clamps x2, tented up, and the  underlying rectus muscle was dissected off sharply with the  electrocautery unit.  The same procedure was repeated on the inferior  aspect of the fascial incision.  Significant adhesive disease was  identified involving the rectus muscles and the anterior abdominal  peritoneum and careful dissection was performed here to confirm intra-  abdominal entry.  The peritoneum was identified and the incision was  extended inferiorly and superiorly with good visualization of the  bladder.  The bladder blade was inserted.   The vesicouterine peritoneum was identified, tented up, and entered  sharply with Metzenbaum scissors and the incision was extended laterally  with the Metzenbaum scissors and a bladder flap was created digitally.  A scalpel was then used  to make a low transverse incision on the uterus  which was extended laterally with blunt dissection and bandage scissors.  The fetal vertex was identified, brought up to the uterine incision.  The infant's head was delivered followed by the shoulders and body.  The  infant cried vigorously on the operative field and was bulb suctioned on  the operative field.  The cord was clamped and cut and the infant was  passed to the awaiting pediatricians.  The placenta was then  spontaneously delivered, the uterus was exteriorized, cleared of all  clot and debris.  The uterine incision was repaired with #1 chromic in a  running fashion.  The uterine incision was inspected and found to be  hemostatic and the electrocautery unit was used to cauterize any  bleeders.  The ovaries and tubes were inspected and found to be normal.  The uterus was returned to the abdominal cavity.   The abdominal  cavity was cleared of clot and debris.  The uterine  incision was reinspected and found to be hemostatic.  The abdominal  peritoneum was closed with 2-0 Vicryl.  The fascia was closed with 9 PDS  in a running fashion.  The subcuticular space was reapproximated with 2-  0 plain gut using interrupted sutures.  The skin was closed with  staples.  All sponge, lap, and needle counts were correct x2.  The  patient tolerated the procedure well and was brought to the recovery  room in stable condition following the procedure.      Freddrick March. Tenny Craw, MD  Electronically Signed     KHR/MEDQ  D:  05/24/2006  T:  05/24/2006  Job:  [id number]   cc:   Carrington Clamp, M.D.  Fax: [phone]

## 2010-09-17 NOTE — Discharge Summary (Signed)
  [name], [name]                ACCOUNT NO.:  [id number]  MEDICAL RECORD NO.:  [id number]           PATIENT TYPE:  I  LOCATION:  9313                          FACILITY:  [facility]  PHYSICIAN:  Ilda Mori, M.D.   DATE OF BIRTH:  Feb 26, 1966  DATE OF ADMISSION:  08/03/2010 DATE OF DISCHARGE:  08/06/2010                              DISCHARGE SUMMARY   FINAL DIAGNOSES: 1. Term pregnancy. 2. Previous cesarean section x3. 3. Living well female infant, Apgars 8 and 9. 4. 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  SECONDARY DIAGNOSIS:  None.  PROCEDURES:  Repeat low transverse cesarean section and left ovarian cystectomy.  SURGEON:  Carrington Clamp, MD.  COMPLICATIONS:  Were none.  CONDITION ON DISCHARGE:  Was improved.  This is a 45 year old gravida 4, para 2-0-1-2 female who presents with a history of 2 previous cesarean sections and a known dermoid cyst of the ovary.  The patient was brought to the operating room on the day of admission by Dr. Carrington Clamp where a repeat low transverse cesarean section and left dermoid cystectomy was performed without complication. The patient's postoperative course was benign without significant fever or anemia.  On the 3rd postop hospital day, she was felt to be ready for discharge.  She was discharged on a regular diet, told to limit her activities.  She was given Percocet 5 for pain management, told to take over-the-counter ibuprofen, told to continue her prenatal vitamins, and asked her to return to the office in 3 weeks for follow up evaluation.  FINAL LABORATORY DATA:  Showed a postoperative hemoglobin of 11.4 with a white count of 9500.  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     Ilda Mori, M.D.     RK/MEDQ  D:  09/13/2010  T:  09/14/2010  Job:  [id number]  Electronically Signed by Ilda Mori M.D. on 09/17/2010 09:42:12 PM

## 2011-01-21 LAB — URINALYSIS, ROUTINE W REFLEX MICROSCOPIC
Bilirubin Urine: NEGATIVE
Hgb urine dipstick: NEGATIVE
Nitrite: NEGATIVE
Protein, ur: NEGATIVE
Urobilinogen, UA: 0.2

## 2011-01-21 LAB — WET PREP, GENITAL: Trich, Wet Prep: NONE SEEN

## 2012-09-10 ENCOUNTER — Other Ambulatory Visit: Payer: Self-pay | Admitting: Physician Assistant

## 2012-09-10 DIAGNOSIS — Z1231 Encounter for screening mammogram for malignant neoplasm of breast: Secondary | ICD-10-CM

## 2012-10-11 ENCOUNTER — Ambulatory Visit: Payer: Medicare Other

## 2012-10-25 ENCOUNTER — Ambulatory Visit
Admission: RE | Admit: 2012-10-25 | Discharge: 2012-10-25 | Disposition: A | Discharge status: Home / Self Care | Payer: Medicare Other | Source: Ambulatory Visit | Attending: Physician Assistant | Admitting: Physician Assistant

## 2012-10-25 ENCOUNTER — Other Ambulatory Visit: Payer: Self-pay | Admitting: Obstetrics and Gynecology

## 2012-10-25 DIAGNOSIS — Z1231 Encounter for screening mammogram for malignant neoplasm of breast: Secondary | ICD-10-CM

## 2014-03-12 ENCOUNTER — Other Ambulatory Visit: Payer: Self-pay | Admitting: Physician Assistant

## 2014-03-12 DIAGNOSIS — Z1231 Encounter for screening mammogram for malignant neoplasm of breast: Secondary | ICD-10-CM

## 2014-03-25 ENCOUNTER — Ambulatory Visit: Payer: Medicare Other

## 2014-03-27 ENCOUNTER — Ambulatory Visit: Payer: Medicare Other

## 2014-04-07 ENCOUNTER — Ambulatory Visit: Payer: Medicare Other

## 2014-04-16 ENCOUNTER — Ambulatory Visit: Payer: Medicare Other

## 2014-05-08 ENCOUNTER — Ambulatory Visit
Admission: RE | Admit: 2014-05-08 | Discharge: 2014-05-08 | Disposition: A | Discharge status: Home / Self Care | Payer: Medicaid Other | Source: Ambulatory Visit | Attending: Physician Assistant | Admitting: Physician Assistant

## 2014-05-08 DIAGNOSIS — Z1231 Encounter for screening mammogram for malignant neoplasm of breast: Secondary | ICD-10-CM

## 2015-11-07 ENCOUNTER — Other Ambulatory Visit: Payer: Self-pay | Admitting: Physician Assistant

## 2015-11-07 DIAGNOSIS — Z1231 Encounter for screening mammogram for malignant neoplasm of breast: Secondary | ICD-10-CM

## 2015-11-17 ENCOUNTER — Ambulatory Visit
Admission: RE | Admit: 2015-11-17 | Discharge: 2015-11-17 | Disposition: A | Discharge status: Home / Self Care | Payer: Medicare Other | Source: Ambulatory Visit | Attending: Physician Assistant | Admitting: Physician Assistant

## 2015-11-17 DIAGNOSIS — Z1231 Encounter for screening mammogram for malignant neoplasm of breast: Secondary | ICD-10-CM

## 2016-04-26 ENCOUNTER — Encounter (HOSPITAL_COMMUNITY): Payer: Self-pay | Admitting: *Deleted

## 2016-04-26 ENCOUNTER — Emergency Department (HOSPITAL_COMMUNITY)
Admission: EM | Admit: 2016-04-26 | Discharge: 2016-04-26 | Disposition: A | Discharge status: Home / Self Care | Payer: Medicare Other | Attending: Emergency Medicine | Admitting: Emergency Medicine

## 2016-04-26 DIAGNOSIS — Y929 Unspecified place or not applicable: Secondary | ICD-10-CM | POA: Insufficient documentation

## 2016-04-26 DIAGNOSIS — X58XXXA Exposure to other specified factors, initial encounter: Secondary | ICD-10-CM | POA: Diagnosis not present

## 2016-04-26 DIAGNOSIS — F17210 Nicotine dependence, cigarettes, uncomplicated: Secondary | ICD-10-CM | POA: Insufficient documentation

## 2016-04-26 DIAGNOSIS — Y999 Unspecified external cause status: Secondary | ICD-10-CM | POA: Diagnosis not present

## 2016-04-26 DIAGNOSIS — M545 Low back pain, unspecified: Secondary | ICD-10-CM

## 2016-04-26 DIAGNOSIS — Y939 Activity, unspecified: Secondary | ICD-10-CM | POA: Diagnosis not present

## 2016-04-26 MED ORDER — OXYCODONE-ACETAMINOPHEN 5-325 MG PO TABS
1.0000 | ORAL_TABLET | Freq: Once | ORAL | Status: AC
  Administered 2016-04-26: 1 via ORAL
  Filled 2016-04-26: qty 1

## 2016-04-26 MED ORDER — IBUPROFEN 800 MG PO TABS
800.0000 mg | ORAL_TABLET | Freq: Once | ORAL | Status: AC
  Administered 2016-04-26: 800 mg via ORAL
  Filled 2016-04-26: qty 1

## 2016-04-26 MED ORDER — CYCLOBENZAPRINE HCL 10 MG PO TABS: 10.0000 mg | ORAL_TABLET | Freq: Three times a day (TID) | ORAL | 0 refills | Status: AC | PRN

## 2016-04-26 MED ORDER — IBUPROFEN 600 MG PO TABS: 600.0000 mg | ORAL_TABLET | Freq: Three times a day (TID) | ORAL | 0 refills | Status: AC | PRN

## 2016-04-26 MED ORDER — DIAZEPAM 5 MG PO TABS
5.0000 mg | ORAL_TABLET | Freq: Once | ORAL | Status: AC
  Administered 2016-04-26: 5 mg via ORAL
  Filled 2016-04-26: qty 1

## 2016-04-26 NOTE — ED Provider Notes (Signed)
[facility] DEPT Provider Note   CSN: [id number] Arrival date & time: 04/26/16  1534     History   Chief Complaint Chief Complaint  Patient presents with  . Back Pain    HPI [name] is a 51 y.o. female.  HPI Patient presents emergency department today with nonspecific low back pain that began when she was attempting to get out of bed.  She denies weakness in her lower extremities.  She denies fevers and chills.  She denies IV drug abuse.  Denies weakness in her legs.  She's been ambulatory in the ER.  Denies abdominal pain.  No nausea or vomiting.  She did not try medication prior to arrival.  Her pain is moderate in severity.  She is morbidly obese and weighs 340 pounds   History reviewed. No pertinent past medical history.  There are no active problems to display for this patient.   Past Surgical History:  Procedure Laterality Date  . CESAREAN SECTION      OB History    No data available       Home Medications    Prior to Admission medications   Not on File    Family History No family history on file.  Social History Social History  Substance Use Topics  . Smoking status: Current Every Day Smoker    Packs/day: 0.50    Types: Cigarettes  . Smokeless tobacco: Never Used  . Alcohol use Yes     Comment: occ     Allergies   Shellfish allergy   Review of Systems Review of Systems  All other systems reviewed and are negative.    Physical Exam Updated Vital Signs BP (!) 149/103   Pulse 66   Temp 97.7 F (36.5 C) (Oral)   Resp 15   Ht 5\' 11"  (1.803 m)   Wt (!) 340 lb (154.2 kg)   LMP 04/19/2016 (Approximate)   SpO2 100%   BMI 47.42 kg/m   Physical Exam  Constitutional: She is oriented to person, place, and time. She appears well-developed and well-nourished.  HENT:  Head: Normocephalic.  Eyes: EOM are normal.  Neck: Normal range of motion.  Pulmonary/Chest: Effort normal.  Abdominal: She exhibits no distension. There is no  tenderness.  Musculoskeletal:  Full range of motion bilateral hips, knees, ankles.  Normal strength in her bilateral lower extremity major muscle groups.  No thoracic or lumbar point tenderness.  Mild paralumbar tenderness and spasm  Neurological: She is alert and oriented to person, place, and time.  Psychiatric: She has a normal mood and affect.  Nursing note and vitals reviewed.    ED Treatments / Results  Labs (all labs ordered are listed, but only abnormal results are displayed) Labs Reviewed - No data to display  EKG  EKG Interpretation None       Radiology No results found.  Procedures Procedures (including critical care time)  Medications Ordered in ED Medications  ibuprofen (ADVIL,MOTRIN) tablet 800 mg (800 mg Oral Given 04/26/16 1711)  oxyCODONE-acetaminophen (PERCOCET/ROXICET) 5-325 MG per tablet 1 tablet (1 tablet Oral Given 04/26/16 1759)  diazepam (VALIUM) tablet 5 mg (5 mg Oral Given 04/26/16 1759)     Initial Impression / Assessment and Plan / ED Course  I have reviewed the triage vital signs and the nursing notes.  Pertinent labs & imaging results that were available during my care of the patient were reviewed by me and considered in my medical decision making (see chart for details).  Clinical Course     Normal lower extremity neurologic exam. No bowel or bladder complaints. No back pain red flags. Likely musculoskeletal back pain. Doubt spinal epidural abscess. Doubt cauda equina. Doubt abdominal aortic aneurysm  Ambulatory in the emergency department.  Overall well-appearing.  Home with anti-inflammatories and muscle relaxants.  Final Clinical Impressions(s) / ED Diagnoses   Final diagnoses:  None    New Prescriptions New Prescriptions   No medications on file     Jola Schmidt, MD 04/26/16 2013

## 2016-04-26 NOTE — ED Notes (Signed)
PT wet bed when attempt to change PT bed in rm 24 PT would not roll for tech to change bed request to be left in wet bed. Info PT unable to leave her in wet sheet. Pt request for staff to leave her as is. Before placing PT back in hall C PT rolled over to side scream at staff she rolled over to be change. Changed PT bed and place back in [place] [facility]

## 2016-04-26 NOTE — ED Notes (Signed)
Pt states she needs pain med and is asking where the doctor is.  Made her aware that the providers are aware that she is waiting to be seen and is trying their best to see her, and that they have to see patients with higher acuity.  She states "well I can not move, so I am as sick as them."  Pt is is able to turn from side to side without any difficulty.

## 2016-04-26 NOTE — ED Triage Notes (Signed)
Per EMS, pt from home, pt reports sudden L low  Back pain today when attempting to get OOB.  Denies any hx of back pain, no injury.  Denies any tingling or numbness in her legs.  Denies any urinary sxs at this time.

## 2016-04-26 NOTE — ED Notes (Signed)
RN and Tech have explained to pt about wait time and have apologized. Pt was given pain medicine 1710 & complains at 1715 that she is still in pain & we have done nothing about it. RN has explained that it takes a while for the medication to go into effect.

## 2016-04-26 NOTE — ED Notes (Signed)
Pt reports she was attempting to get OOB this am when she started to have a sudden severe L low back pain.  Pain is worse with movement.  Denies tingling or numbness in her legs or urinary sxs at this time.  Pt is able to move her legs without difficulty or making the pain worse.

## 2016-04-26 NOTE — ED Notes (Signed)
Pt ambulatory to restroom

## 2016-04-26 NOTE — ED Notes (Signed)
Pt would not allow me or 2nd tech to help her roll so that we could change her. When placing a bed pan under the pt she had no difficulties moving. Pt started to move while on bed pan. I informed her that if she kept moving she would place herself off the bed pan. She pushed hips in air and urinated all over sheets. When asking her to let us move her so we could get her clean and dry the pt refused to move. As we were rolling her out of the room back into the hall she started to move and yell "wait wait I can move now...you cant leave me like this. Change me again."

## 2016-04-26 NOTE — ED Notes (Signed)
Pt A&O and ambulatory with independent, steady gait.  Pt approaches nurse's station several times asking for food and reports relief of pain.

## 2017-07-28 ENCOUNTER — Encounter: Payer: Self-pay | Admitting: Gastroenterology

## 2017-08-01 ENCOUNTER — Institutional Professional Consult (permissible substitution): Payer: Medicare Other | Admitting: Pulmonary Disease

## 2017-09-04 ENCOUNTER — Other Ambulatory Visit: Payer: Self-pay | Admitting: Physician Assistant

## 2017-09-04 DIAGNOSIS — Z1231 Encounter for screening mammogram for malignant neoplasm of breast: Secondary | ICD-10-CM

## 2017-09-12 ENCOUNTER — Institutional Professional Consult (permissible substitution): Payer: Medicare Other | Admitting: Pulmonary Disease

## 2017-09-12 ENCOUNTER — Telehealth: Payer: Self-pay | Admitting: *Deleted

## 2017-09-12 NOTE — Telephone Encounter (Signed)
Attempted to call patient again and no answer.  Unable to leave message.  I will send out a no show letter to patient and cancel her PV and colonoscopy appointments.  B.Benitez, CMA  PV

## 2017-09-12 NOTE — Telephone Encounter (Signed)
Attempted to call patient's home number and no answer.  No answering machine to leave message.  I called her mother and spoke with her to have her tell patient to call and reschedule.  I will attempt to call again and send no show letter if unable to reach.  B.Benitez, CMA  PV

## 2017-09-13 ENCOUNTER — Encounter: Payer: Self-pay | Admitting: Physician Assistant

## 2017-09-26 ENCOUNTER — Encounter: Payer: Medicare Other | Admitting: Gastroenterology

## 2017-09-27 ENCOUNTER — Ambulatory Visit: Payer: Medicare Other

## 2018-04-13 ENCOUNTER — Emergency Department (HOSPITAL_COMMUNITY): Payer: Medicare Other

## 2018-04-13 ENCOUNTER — Emergency Department (HOSPITAL_COMMUNITY)
Admission: EM | Admit: 2018-04-13 | Discharge: 2018-04-14 | Disposition: A | Discharge status: Home / Self Care | Payer: Medicare Other | Attending: Emergency Medicine | Admitting: Emergency Medicine

## 2018-04-13 ENCOUNTER — Encounter (HOSPITAL_COMMUNITY): Payer: Self-pay | Admitting: Emergency Medicine

## 2018-04-13 ENCOUNTER — Other Ambulatory Visit: Payer: Self-pay

## 2018-04-13 DIAGNOSIS — I10 Essential (primary) hypertension: Secondary | ICD-10-CM | POA: Insufficient documentation

## 2018-04-13 DIAGNOSIS — R059 Cough, unspecified: Secondary | ICD-10-CM

## 2018-04-13 DIAGNOSIS — F17210 Nicotine dependence, cigarettes, uncomplicated: Secondary | ICD-10-CM | POA: Diagnosis not present

## 2018-04-13 DIAGNOSIS — R05 Cough: Secondary | ICD-10-CM | POA: Diagnosis not present

## 2018-04-13 DIAGNOSIS — R079 Chest pain, unspecified: Secondary | ICD-10-CM | POA: Diagnosis not present

## 2018-04-13 LAB — BASIC METABOLIC PANEL
ANION GAP: 12 (ref 5–15)
BUN: 12 mg/dL (ref 6–20)
CO2: 21 mmol/L — ABNORMAL LOW (ref 22–32)
Calcium: 9.4 mg/dL (ref 8.9–10.3)
Chloride: 107 mmol/L (ref 98–111)
Creatinine, Ser: 0.96 mg/dL (ref 0.44–1.00)
GFR calc Af Amer: >60 mL/min (ref >60)
Glucose, Bld: 110 mg/dL — ABNORMAL HIGH (ref 70–99)
POTASSIUM: 3.4 mmol/L — AB (ref 3.5–5.1)
SODIUM: 140 mmol/L (ref 135–145)

## 2018-04-13 LAB — CBC
HCT: 42.2 % (ref 36.0–46.0)
HEMOGLOBIN: 13.2 g/dL (ref 12.0–15.0)
MCH: 28.2 pg (ref 26.0–34.0)
MCHC: 31.3 g/dL (ref 30.0–36.0)
MCV: 90.2 fL (ref 80.0–100.0)
NRBC: 0 % (ref 0.0–0.2)
Platelets: 357 10*3/uL (ref 150–400)
RBC: 4.68 MIL/uL (ref 3.87–5.11)
RDW: 15.8 % — ABNORMAL HIGH (ref 11.5–15.5)
WBC: 12.7 10*3/uL — AB (ref 4.0–10.5)

## 2018-04-13 LAB — I-STAT BETA HCG BLOOD, ED (MC, WL, AP ONLY)

## 2018-04-13 LAB — I-STAT TROPONIN, ED: TROPONIN I, POC: 0 ng/mL (ref 0.00–0.08)

## 2018-04-13 LAB — D-DIMER, QUANTITATIVE (NOT AT ARMC): D DIMER QUANT: 0.75 ug{FEU}/mL — AB (ref 0.00–0.50)

## 2018-04-13 MED ORDER — ALBUTEROL SULFATE HFA 108 (90 BASE) MCG/ACT IN AERS: 1.0000 | INHALATION_SPRAY | Freq: Four times a day (QID) | RESPIRATORY_TRACT | 0 refills | Status: DC | PRN

## 2018-04-13 MED ORDER — AZITHROMYCIN 250 MG PO TABS: 250.0000 mg | ORAL_TABLET | Freq: Every day | ORAL | 0 refills | Status: AC

## 2018-04-13 NOTE — ED Triage Notes (Signed)
C/o intermittent cough with brown sputum, SOB, pain to center of chest, and back pain since October.  Seen by PCP today and MD told her to come to ED because she was unable to order chest CT due to dye allergy.

## 2018-04-13 NOTE — ED Provider Notes (Signed)
[facility] EMERGENCY DEPARTMENT Provider Note   CSN: [id number] Arrival date & time: 04/13/18  1938     History   Chief Complaint Chief Complaint  Patient presents with  . Cough  . Chest Pain  . Shortness of Breath    HPI [name] is a 52 y.o. female.  HPI   Patient is a 52 year old female with a past medical history of hypertension and tobacco abuse with significant pack-year history presents after being referred to the emergency department by her PCP for further evaluation and management of a cough associated with right-sided chest pain and shortness of breath that has been present for approximately 1 month.  Patient notes she has sputum production that has increased over the last several days and is occasionally blood-streaked.  She denies any left-sided chest pain, fevers, vomiting, diarrhea, dysuria, abdominal pain, extremity pain, rash, headache, earache, sore throat, or other acute complaints.  Denies any illicit drug use, sick contacts, or recent travel out of [place].  Denies prior similar episodes.  Denies alleviating or aggravating factors.  History reviewed. No pertinent past medical history.  There are no active problems to display for this patient.   Past Surgical History:  Procedure Laterality Date  . CESAREAN SECTION       OB History   No obstetric history on file.      Home Medications    Prior to Admission medications   Medication Sig Start Date End Date Taking? Authorizing Provider  albuterol (PROVENTIL HFA;VENTOLIN HFA) 108 (90 Base) MCG/ACT inhaler Inhale 1-2 puffs into the lungs every 6 (six) hours as needed for wheezing or shortness of breath. 04/13/18   Hulan Saas, MD  azithromycin (ZITHROMAX Z-PAK) 250 MG tablet Take 1 tablet (250 mg total) by mouth daily for 5 days. 04/13/18 04/18/18  Hulan Saas, MD  cyclobenzaprine (FLEXERIL) 10 MG tablet Take 1 tablet (10 mg total) by mouth 3 (three) times daily as needed  for muscle spasms. 04/26/16   Jola Schmidt, MD  ibuprofen (ADVIL,MOTRIN) 600 MG tablet Take 1 tablet (600 mg total) by mouth every 8 (eight) hours as needed. 04/26/16   Jola Schmidt, MD    Family History No family history on file.  Social History Social History   Tobacco Use  . Smoking status: Current Every Day Smoker    Packs/day: 0.50    Types: Cigarettes  . Smokeless tobacco: Never Used  Substance Use Topics  . Alcohol use: Yes    Comment: occ  . Drug use: Yes    Types: Marijuana    Comment: occ     Allergies   Shellfish allergy   Review of Systems Review of Systems  Constitutional: Negative for chills and fever.  HENT: Negative for ear pain and sore throat.   Eyes: Negative for pain and visual disturbance.  Respiratory: Positive for cough and shortness of breath.   Cardiovascular: Positive for chest pain. Negative for palpitations.  Gastrointestinal: Negative for abdominal pain and vomiting.  Genitourinary: Negative for dysuria and hematuria.  Musculoskeletal: Negative for arthralgias and back pain.  Skin: Negative for color change and rash.  Neurological: Negative for seizures and syncope.  All other systems reviewed and are negative.    Physical Exam Updated Vital Signs BP (!) 151/69   Pulse 68   Temp 98.7 F (37.1 C) (Oral)   Resp 13   Ht 5\' 10"  (1.778 m)   Wt 127.9 kg   LMP 04/19/2016 (Approximate)   SpO2 100%  BMI 40.46 kg/m   Physical Exam Vitals signs and nursing note reviewed.  Constitutional:      General: She is not in acute distress.    Appearance: She is well-developed. She is obese.  HENT:     Head: Normocephalic and atraumatic.  Eyes:     Conjunctiva/sclera: Conjunctivae normal.     Pupils: Pupils are equal, round, and reactive to light.  Neck:     Musculoskeletal: Neck supple.  Cardiovascular:     Rate and Rhythm: Normal rate and regular rhythm.     Pulses:          Radial pulses are 2+ on the right side and 2+ on the left  side.     Heart sounds: No murmur. No diastolic murmur.  Pulmonary:     Effort: Pulmonary effort is normal. No respiratory distress.     Breath sounds: Normal breath sounds.  Abdominal:     Palpations: Abdomen is soft.     Tenderness: There is no abdominal tenderness.  Skin:    General: Skin is warm and dry.  Neurological:     Mental Status: She is alert.      ED Treatments / Results  Labs (all labs ordered are listed, but only abnormal results are displayed) Labs Reviewed  BASIC METABOLIC PANEL - Abnormal; Notable for the following components:      Result Value   Potassium 3.4 (*)    CO2 21 (*)    Glucose, Bld 110 (*)    All other components within normal limits  CBC - Abnormal; Notable for the following components:   WBC 12.7 (*)    RDW 15.8 (*)    All other components within normal limits  D-DIMER, QUANTITATIVE (NOT AT [facility]) - Abnormal; Notable for the following components:   D-Dimer, Quant 0.75 (*)    All other components within normal limits  I-STAT TROPONIN, ED  I-STAT BETA HCG BLOOD, ED ([facility], [facility], [facility] ONLY)    EKG EKG Interpretation  Date/Time:  Friday April 13 2018 21:32:54 EST Ventricular Rate:  69 PR Interval:    QRS Duration: 89 QT Interval:  412 QTC Calculation: 442 R Axis:   82 Text Interpretation:  Sinus rhythm No significant change since last tracing Confirmed by Isla Pence [phone]) on 04/13/2018 9:39:06 PM   Radiology Dg Chest 2 View  Result Date: 04/13/2018 CLINICAL DATA:  Productive cough, chest pain and sob all x 2 months. PCP questioning PE. Smoker.chest pain EXAM: CHEST - 2 VIEW COMPARISON:  None. FINDINGS: Normal mediastinum and cardiac silhouette. Normal pulmonary vasculature. No evidence of effusion, infiltrate, or pneumothorax. No acute bony abnormality. IMPRESSION: No acute cardiopulmonary process. Electronically Signed   By: Suzy Bouchard M.D.   On: 04/13/2018 20:32   Ct Chest Wo Contrast  Result Date: 04/13/2018 CLINICAL  DATA:  Cough and shortness of breath EXAM: CT CHEST WITHOUT CONTRAST TECHNIQUE: Multidetector CT imaging of the chest was performed following the standard protocol without IV contrast. COMPARISON:  Plain films of earlier in the day FINDINGS: Cardiovascular: Normal caliber of the aorta and branch vessels. Normal heart size, without pericardial effusion. Mediastinum/Nodes: No mediastinal or definite hilar adenopathy, given limitations of unenhanced CT. Subtle fluid level in the esophagus on image 65/3. Lungs/Pleura: No pleural fluid. Mild centrilobular emphysema. Bibasilar areas of mild scarring. Upper Abdomen: Normal imaged portions of the liver, spleen, stomach, pancreas, right adrenal gland, and left kidney. Mild left adrenal thickening. Musculoskeletal: Mild thoracic spondylosis. IMPRESSION: 1. No acute process  in the chest. 2. Esophageal air fluid level suggests dysmotility or gastroesophageal reflux. Emphysema (ICD10-J43.9). Electronically Signed   By: Abigail Miyamoto M.D.   On: 04/13/2018 23:20    Procedures Procedures (including critical care time)  Medications Ordered in ED Medications - No data to display   Initial Impression / Assessment and Plan / ED Course  I have reviewed the triage vital signs and the nursing notes.  Pertinent labs & imaging results that were available during my care of the patient were reviewed by me and considered in my medical decision making (see chart for details).     Patient is a 52 year old female who presents with above-stated history exam.  On presentation patient is afebrile stable vital signs.  Exam as above remarkable lungs clear to auscultation bilaterally, abdomen soft nontender, bilateral 2+ radial pulses, and otherwise unremarkable exam.  Doubt ACS given EKG remarkable for NSR with ventricular rate of 69, normal intervals, no signs of acute ischemic change.  In addition troponin is undetectable patient states she does not have any current pain at  presentation.  BMP shows no significant electrolyte abnormalities.  CBC shows WBC of 12.7, hemoglobin 13.2, platelets 357.  CT of the patient's chest was obtained due to concern for occult pneumonia.  CT showed no findings consistent with pneumonia, pneumothorax, significant pulmonary edema or pleural effusion.  Given patient's significant tobacco history of mild lymphocytosis with increased cough and sputum production over the last several days there is concern for COPD exacerbation.  In addition there is some concern for PE given elevated d-dimer.  Given patient has an anaphylactic allergy to contrast a VQ scan was ordered.  Plan is to discharge patient if VQ scan is negative with prescriptions for albuterol and a Z-Pak.  Patient has a PE she will likely require admission for initiation of anticoagulation.  This plan of care was discussed with PA Antonietta Breach who voiced understanding and agreement of this plan.  Final Clinical Impressions(s) / ED Diagnoses   Final diagnoses:  None    ED Discharge Orders         Ordered    albuterol (PROVENTIL HFA;VENTOLIN HFA) 108 (90 Base) MCG/ACT inhaler  Every 6 hours PRN     04/13/18 2345    azithromycin (ZITHROMAX Z-PAK) 250 MG tablet  Daily     04/13/18 2345           Hulan Saas, MD 04/14/18 Albina Billet    Isla Pence, MD 04/14/18 [phone]

## 2018-04-14 ENCOUNTER — Emergency Department (HOSPITAL_COMMUNITY): Payer: Medicare Other

## 2018-04-14 MED ORDER — TECHNETIUM TC 99M DIETHYLENETRIAME-PENTAACETIC ACID
30.3000 | Freq: Once | INTRAVENOUS | Status: AC | PRN
  Administered 2018-04-14: 30.3 via RESPIRATORY_TRACT

## 2018-04-14 MED ORDER — TECHNETIUM TO 99M ALBUMIN AGGREGATED
4.3200 | Freq: Once | INTRAVENOUS | Status: AC | PRN
  Administered 2018-04-14: 4.32 via INTRAVENOUS

## 2018-04-14 NOTE — ED Notes (Signed)
Patient transported to VQ 

## 2018-04-14 NOTE — ED Provider Notes (Signed)
2:46 AM Patient care assumed from Dr. Tamala Julian at change of shift.  Patient presenting for evaluation of 1 month of right-sided chest pain and shortness of breath.  Was sent to the ED for evaluation of PE.  She was pending VQ scan which has been reviewed and shows low likelihood of PE.  Chest CT earlier today also without acute cardiopulmonary process.  Esophageal air-fluid levels suggestive of dysmotility or esophageal reflux.  I have advised the patient to start Prilosec OTC as a component of her symptoms may be secondary to reflux or esophageal spasm.  Return precautions discussed and provided. Patient discharged in stable condition with no unaddressed concerns.  Vitals:   04/14/18 0157 04/14/18 0200 04/14/18 0230 04/14/18 0246  BP: 137/69   128/77  Pulse:  (!) 58 (!) 59   Resp:  (!) 22 (!) 22   Temp:      TempSrc:      SpO2:  100% 99%   Weight:      Height:        Results for orders placed or performed during the hospital encounter of 35/00/93  Basic metabolic panel  Result Value Ref Range   Sodium 140 135 - 145 mmol/L   Potassium 3.4 (L) 3.5 - 5.1 mmol/L   Chloride 107 98 - 111 mmol/L   CO2 21 (L) 22 - 32 mmol/L   Glucose, Bld 110 (H) 70 - 99 mg/dL   BUN 12 6 - 20 mg/dL   Creatinine, Ser 0.96 0.44 - 1.00 mg/dL   Calcium 9.4 8.9 - 10.3 mg/dL   GFR calc non Af Amer >60 >60 mL/min   GFR calc Af Amer >60 >60 mL/min   Anion gap 12 5 - 15  CBC  Result Value Ref Range   WBC 12.7 (H) 4.0 - 10.5 K/uL   RBC 4.68 3.87 - 5.11 MIL/uL   Hemoglobin 13.2 12.0 - 15.0 g/dL   HCT 42.2 36.0 - 46.0 %   MCV 90.2 80.0 - 100.0 fL   MCH 28.2 26.0 - 34.0 pg   MCHC 31.3 30.0 - 36.0 g/dL   RDW 15.8 (H) 11.5 - 15.5 %   Platelets 357 150 - 400 K/uL   nRBC 0.0 0.0 - 0.2 %  D-dimer, quantitative (not at [facility])  Result Value Ref Range   D-Dimer, Quant 0.75 (H) 0.00 - 0.50 ug/mL-FEU  I-stat troponin, ED  Result Value Ref Range   Troponin i, poc 0.00 0.00 - 0.08 ng/mL   Comment 3          I-Stat beta  hCG blood, ED  Result Value Ref Range   I-stat hCG, quantitative <5.0 <5 mIU/mL   Comment 3           Dg Chest 2 View  Result Date: 04/13/2018 CLINICAL DATA:  Productive cough, chest pain and sob all x 2 months. PCP questioning PE. Smoker.chest pain EXAM: CHEST - 2 VIEW COMPARISON:  None. FINDINGS: Normal mediastinum and cardiac silhouette. Normal pulmonary vasculature. No evidence of effusion, infiltrate, or pneumothorax. No acute bony abnormality. IMPRESSION: No acute cardiopulmonary process. Electronically Signed   By: Suzy Bouchard M.D.   On: 04/13/2018 20:32   Ct Chest Wo Contrast  Result Date: 04/13/2018 CLINICAL DATA:  Cough and shortness of breath EXAM: CT CHEST WITHOUT CONTRAST TECHNIQUE: Multidetector CT imaging of the chest was performed following the standard protocol without IV contrast. COMPARISON:  Plain films of earlier in the day FINDINGS: Cardiovascular: Normal caliber of the aorta  and branch vessels. Normal heart size, without pericardial effusion. Mediastinum/Nodes: No mediastinal or definite hilar adenopathy, given limitations of unenhanced CT. Subtle fluid level in the esophagus on image 65/3. Lungs/Pleura: No pleural fluid. Mild centrilobular emphysema. Bibasilar areas of mild scarring. Upper Abdomen: Normal imaged portions of the liver, spleen, stomach, pancreas, right adrenal gland, and left kidney. Mild left adrenal thickening. Musculoskeletal: Mild thoracic spondylosis. IMPRESSION: 1. No acute process in the chest. 2. Esophageal air fluid level suggests dysmotility or gastroesophageal reflux. Emphysema (ICD10-J43.9). Electronically Signed   By: Abigail Miyamoto M.D.   On: 04/13/2018 23:20   Nm Pulmonary Vent And Perf (v/q Scan)  Result Date: 04/14/2018 CLINICAL DATA:  Chronic right-sided chest pain, shortness of breath and cough. Blood-streaked sputum. EXAM: NUCLEAR MEDICINE VENTILATION - PERFUSION LUNG SCAN TECHNIQUE: Ventilation images were obtained in multiple  projections using inhaled aerosol Tc-62m DTPA. Perfusion images were obtained in multiple projections after intravenous injection of Tc-74m MAA. RADIOPHARMACEUTICALS:  30.3 mCi of Tc-60m DTPA aerosol inhalation and 4.32 mCi Tc10m MAA IV COMPARISON:  Chest radiograph performed 04/13/2018 FINDINGS: Ventilation: Ventilation images are significantly suboptimal, with artifactual accumulation of activity at the bronchi bilaterally. Ventilation appears somewhat heterogeneous, without a well-defined focal defect. Perfusion: No wedge shaped peripheral perfusion defects to suggest acute pulmonary embolism. IMPRESSION: Low probability for pulmonary embolus. Suboptimal ventilation images. Perfusion images are within normal limits. Electronically Signed   By: Garald Balding M.D.   On: 04/14/2018 02:17      Antonietta Breach, PA-C 04/14/18 Prien, Blanco, DO 04/14/18 [phone]

## 2018-04-14 NOTE — ED Notes (Signed)
Pt stable and ambulatory for discharge, states understanding follow up.  

## 2018-04-14 NOTE — Discharge Instructions (Signed)
Your work-up in the emergency department was reassuring.  Your VQ scan showed low probability of pulmonary embolus/blood clot in your lung.  We recommend that you continue follow-up with your primary care doctor.  Start taking Prilosec OTC as this may help with your sputum production and chest discomfort given that your imaging findings are suggestive of reflux.  You may return to the ED for new or concerning symptoms.

## 2019-09-26 IMAGING — NM NM PULMONARY VENT & PERF
16 series · 16 of 16 positions shown · non-contrast
Comparison: Chest radiograph performed 04/13/2018

CLINICAL DATA: Chronic right-sided chest pain, shortness of breath
and cough. Blood-streaked sputum.

EXAM:
NUCLEAR MEDICINE VENTILATION - PERFUSION LUNG SCAN
TECHNIQUE: Ventilation images were obtained in multiple projections using
inhaled aerosol Fc-XXm DTPA. Perfusion images were obtained in
multiple projections after intravenous injection of Fc-XXm MAA.
RADIOPHARMACEUTICALS:  30.3 mCi of Fc-XXm DTPA aerosol inhalation
and 4.32 mCi Gc55m MAA IV

[Series 1: ant/post vent · 4.14mm/px · 1 of 1 slices shown (1 of 2)]
[im 1/1]
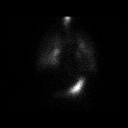

[Series 1: ant/post vent · 4.14mm/px · 1 of 1 slices shown (2 of 2)]
[im 1/1]
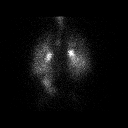

[Series 2: lao/rpo vent · 4.14mm/px · 1 of 1 slices shown (1 of 2)]
[im 1/1]
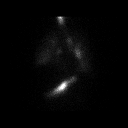

[Series 2: lao/rpo vent · 4.14mm/px · 1 of 1 slices shown (2 of 2)]
[im 1/1]
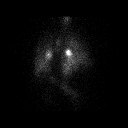

[Series 3: lpo/rao vent · 4.14mm/px · 1 of 1 slices shown (1 of 2)]
[im 1/1]
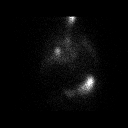

[Series 3: lpo/rao vent · 4.14mm/px · 1 of 1 slices shown (2 of 2)]
[im 1/1]
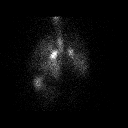

[Series 4: lt lat/rt lat vent · 4.14mm/px · 1 of 1 slices shown (1 of 2)]
[im 1/1  full-range]
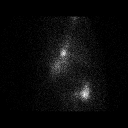

[Series 4: lt lat/rt lat vent · 4.14mm/px · 1 of 1 slices shown (2 of 2)]
[im 1/1]
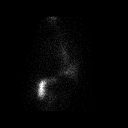

[Series 5: lt lat/rt lat perf · 4.14mm/px · 1 of 1 slices shown (1 of 2)]
[im 1/1]
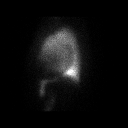

[Series 5: lt lat/rt lat perf · 4.14mm/px · 1 of 1 slices shown (2 of 2)]
[im 1/1]
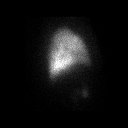

[Series 6: lpo/rao perf · 4.14mm/px · 1 of 1 slices shown (1 of 2)]
[im 1/1]
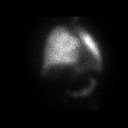

[Series 6: lpo/rao perf · 4.14mm/px · 1 of 1 slices shown (2 of 2)]
[im 1/1]
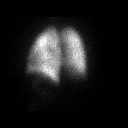

[Series 7: ant/post perf · 4.14mm/px · 1 of 1 slices shown (1 of 2)]
[im 1/1]
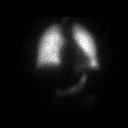

[Series 7: ant/post perf · 4.14mm/px · 1 of 1 slices shown (2 of 2)]
[im 1/1]
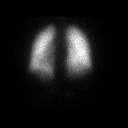

[Series 8: lao/rpo perf · 4.14mm/px · 1 of 1 slices shown (1 of 2)]
[im 1/1]
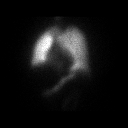

[Series 8: lao/rpo perf · 4.14mm/px · 1 of 1 slices shown (2 of 2)]
[im 1/1]
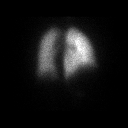

[16 of 16 positions shown; findings below may reference images not displayed]

FINDINGS: Ventilation: Ventilation images are significantly suboptimal, with
artifactual accumulation of activity at the bronchi bilaterally.
Ventilation appears somewhat heterogeneous, without a well-defined
focal defect.

Perfusion: No wedge shaped peripheral perfusion defects to suggest
acute pulmonary embolism.
IMPRESSION: Low probability for pulmonary embolus. Suboptimal ventilation
images. Perfusion images are within normal limits.

## 2020-01-06 ENCOUNTER — Encounter (HOSPITAL_COMMUNITY): Payer: Self-pay

## 2020-01-06 ENCOUNTER — Ambulatory Visit (HOSPITAL_COMMUNITY)
Admission: EM | Admit: 2020-01-06 | Discharge: 2020-01-06 | Disposition: A | Discharge status: Home / Self Care | Payer: Medicare Other | Attending: Family Medicine | Admitting: Family Medicine

## 2020-01-06 ENCOUNTER — Other Ambulatory Visit: Payer: Self-pay

## 2020-01-06 DIAGNOSIS — F17210 Nicotine dependence, cigarettes, uncomplicated: Secondary | ICD-10-CM | POA: Diagnosis not present

## 2020-01-06 DIAGNOSIS — J069 Acute upper respiratory infection, unspecified: Secondary | ICD-10-CM | POA: Insufficient documentation

## 2020-01-06 DIAGNOSIS — R21 Rash and other nonspecific skin eruption: Secondary | ICD-10-CM | POA: Diagnosis not present

## 2020-01-06 DIAGNOSIS — Z20822 Contact with and (suspected) exposure to covid-19: Secondary | ICD-10-CM | POA: Diagnosis not present

## 2020-01-06 DIAGNOSIS — R05 Cough: Secondary | ICD-10-CM | POA: Diagnosis present

## 2020-01-06 MED ORDER — NYSTATIN 100000 UNIT/GM EX CREA: 1.0000 "application " | TOPICAL_CREAM | Freq: Two times a day (BID) | CUTANEOUS | 0 refills | Status: AC

## 2020-01-06 MED ORDER — ALBUTEROL SULFATE HFA 108 (90 BASE) MCG/ACT IN AERS: 1.0000 | INHALATION_SPRAY | Freq: Four times a day (QID) | RESPIRATORY_TRACT | 0 refills | Status: DC | PRN

## 2020-01-06 MED ORDER — ACETAMINOPHEN 500 MG PO TABS: 500.0000 mg | ORAL_TABLET | Freq: Four times a day (QID) | ORAL | 0 refills | Status: AC | PRN

## 2020-01-06 MED ORDER — ALBUTEROL SULFATE HFA 108 (90 BASE) MCG/ACT IN AERS: 1.0000 | INHALATION_SPRAY | Freq: Four times a day (QID) | RESPIRATORY_TRACT | 0 refills | Status: AC | PRN

## 2020-01-06 NOTE — ED Triage Notes (Signed)
Pt presents with non productive cough, headache, generalized body aches, nasal drainage, and congestion X 2 days

## 2020-01-06 NOTE — ED Provider Notes (Signed)
[place]    CSN: [id number] Arrival date & time: 01/06/20  1919      History   Chief Complaint Chief Complaint  Patient presents with   Cough   Headache   Generalized Body Aches    HPI [name] is a 54 y.o. female.   Mauri Brooklyn presents with complaints of headache, runny nose, cough, congested cough- productive of dark phlegm, as well as rash around breasts. URI symptoms started two days ago. No fevers. Smokes. Feels shortness of breath . Wheezing. No asthma. Her daughter was sick first with URI. Fully vaccinated for covid-19. No other known ill contacts. Nausea. No vomiting or diarrhea. Rash is painful. Does itch. Hasn't applied anything to her rash.    ROS per HPI, negative if not otherwise mentioned.      History reviewed. No pertinent past medical history.  There are no problems to display for this patient.   Past Surgical History:  Procedure Laterality Date   CESAREAN SECTION      OB History   No obstetric history on file.      Home Medications    Prior to Admission medications   Medication Sig Start Date End Date Taking? Authorizing Provider  acetaminophen (TYLENOL) 500 MG tablet Take 1 tablet (500 mg total) by mouth every 6 (six) hours as needed. 01/06/20   Zigmund Gottron, NP  albuterol (VENTOLIN HFA) 108 (90 Base) MCG/ACT inhaler Inhale 1-2 puffs into the lungs every 6 (six) hours as needed for wheezing or shortness of breath. 01/06/20   Zigmund Gottron, NP  cyclobenzaprine (FLEXERIL) 10 MG tablet Take 1 tablet (10 mg total) by mouth 3 (three) times daily as needed for muscle spasms. 04/26/16   Jola Schmidt, MD  ibuprofen (ADVIL,MOTRIN) 600 MG tablet Take 1 tablet (600 mg total) by mouth every 8 (eight) hours as needed. 04/26/16   Jola Schmidt, MD  nystatin cream (MYCOSTATIN) Apply 1 application topically 2 (two) times daily. 01/06/20   Zigmund Gottron, NP    Family History Family History  Family history unknown: Yes     Social History Social History   Tobacco Use   Smoking status: Current Every Day Smoker    Packs/day: 0.50    Types: Cigarettes   Smokeless tobacco: Never Used  Substance Use Topics   Alcohol use: Yes    Comment: occ   Drug use: Yes    Types: Marijuana    Comment: occ     Allergies   Shellfish allergy   Review of Systems Review of Systems   Physical Exam Triage Vital Signs ED Triage Vitals  Enc Vitals Group     BP 01/06/20 2103 (!) 158/75     Pulse Rate 01/06/20 2103 60     Resp 01/06/20 2103 19     Temp 01/06/20 2103 98 F (36.7 C)     Temp Source 01/06/20 2103 Oral     SpO2 01/06/20 2103 100 %     Weight --      Height --      Head Circumference --      Peak Flow --      Pain Score 01/06/20 2105 5     Pain Loc --      Pain Edu? --      Excl. in [place]? --    No data found.  Updated Vital Signs BP (!) 158/75 (BP Location: Left Arm)    Pulse 60    Temp  98 F (36.7 C) (Oral)    Resp 19    LMP 04/19/2016 (Approximate)    SpO2 100%   Visual Acuity Right Eye Distance:   Left Eye Distance:   Bilateral Distance:    Right Eye Near:   Left Eye Near:    Bilateral Near:     Physical Exam Constitutional:      General: She is not in acute distress.    Appearance: She is well-developed.  Cardiovascular:     Rate and Rhythm: Normal rate.  Pulmonary:     Effort: Pulmonary effort is normal.     Comments: Congested cough noted  Skin:    General: Skin is warm and dry.     Comments: Under bilateral breasts with hyperpigmented rash which is non tender; no redness or warmth  Neurological:     Mental Status: She is alert and oriented to person, place, and time.      UC Treatments / Results  Labs (all labs ordered are listed, but only abnormal results are displayed) Labs Reviewed  SARS CORONAVIRUS 2 (TAT 6-24 HRS)    EKG   Radiology No results found.  Procedures Procedures (including critical care time)  Medications Ordered in  UC Medications - No data to display  Initial Impression / Assessment and Plan / UC Course  I have reviewed the triage vital signs and the nursing notes.  Pertinent labs & imaging results that were available during my care of the patient were reviewed by me and considered in my medical decision making (see chart for details).     Non toxic. Benign physical exam.  No work of breathing. Afebrile. No tachypnea, hypoxia or tachycardia. Children also with URI's. History and physical consistent with viral illness.  Covid testing pending and isolation instructions provided.  Supportive cares recommended. Inhaler provided. Concern for candidal rash under breasts with nystatin provided. Return precautions provided. Patient verbalized understanding and agreeable to plan.   Final Clinical Impressions(s) / UC Diagnoses   Final diagnoses:  Upper respiratory tract infection, unspecified type  Rash     Discharge Instructions     Push fluids to ensure adequate hydration and keep secretions thin.  Tylenol and/or ibuprofen as needed for pain or fevers.  Use of inhaler as needed for wheezing or shortness of breath.   Over the counter medications as needed for symptoms.  You may try some vitamins to help your immune system potentially:  Vitamin C 500mg  twice a day. Zinc 50mg  daily. Vitamin D 5000IU daily.   Please follow up with your primary care provider for recheck of your symptoms and for mammogram     ED Prescriptions    Medication Sig Dispense Auth. Provider   albuterol (VENTOLIN HFA) 108 (90 Base) MCG/ACT inhaler  (Status: Discontinued) Inhale 1-2 puffs into the lungs every 6 (six) hours as needed for wheezing or shortness of breath. 8 g Augusto Gamble B, NP   nystatin cream (MYCOSTATIN) Apply 1 application topically 2 (two) times daily. 30 g Augusto Gamble B, NP   albuterol (VENTOLIN HFA) 108 (90 Base) MCG/ACT inhaler Inhale 1-2 puffs into the lungs every 6 (six) hours as needed for wheezing or  shortness of breath. 8 g Augusto Gamble B, NP   acetaminophen (TYLENOL) 500 MG tablet Take 1 tablet (500 mg total) by mouth every 6 (six) hours as needed. 30 tablet Zigmund Gottron, NP     PDMP not reviewed this encounter.   Zigmund Gottron, NP 01/08/20 [phone]

## 2020-01-06 NOTE — Discharge Instructions (Addendum)
Push fluids to ensure adequate hydration and keep secretions thin.  Tylenol and/or ibuprofen as needed for pain or fevers.  Use of inhaler as needed for wheezing or shortness of breath.   Over the counter medications as needed for symptoms.  You may try some vitamins to help your immune system potentially:  Vitamin C 500mg  twice a day. Zinc 50mg  daily. Vitamin D 5000IU daily.   Please follow up with your primary care provider for recheck of your symptoms and for mammogram

## 2020-01-07 LAB — SARS CORONAVIRUS 2 (TAT 6-24 HRS): SARS Coronavirus 2: NEGATIVE

## 2021-05-04 ENCOUNTER — Emergency Department (HOSPITAL_BASED_OUTPATIENT_CLINIC_OR_DEPARTMENT_OTHER)
Admission: EM | Admit: 2021-05-04 | Discharge: 2021-05-04 | Disposition: A | Discharge status: Home / Self Care | Payer: Medicare Other | Attending: Emergency Medicine | Admitting: Emergency Medicine

## 2021-05-04 ENCOUNTER — Encounter (HOSPITAL_BASED_OUTPATIENT_CLINIC_OR_DEPARTMENT_OTHER): Payer: Self-pay | Admitting: Emergency Medicine

## 2021-05-04 ENCOUNTER — Emergency Department (HOSPITAL_BASED_OUTPATIENT_CLINIC_OR_DEPARTMENT_OTHER): Payer: Medicare Other

## 2021-05-04 ENCOUNTER — Other Ambulatory Visit: Payer: Self-pay

## 2021-05-04 DIAGNOSIS — M542 Cervicalgia: Secondary | ICD-10-CM | POA: Diagnosis not present

## 2021-05-04 DIAGNOSIS — R109 Unspecified abdominal pain: Secondary | ICD-10-CM | POA: Diagnosis not present

## 2021-05-04 DIAGNOSIS — R519 Headache, unspecified: Secondary | ICD-10-CM | POA: Insufficient documentation

## 2021-05-04 DIAGNOSIS — Y92410 Unspecified street and highway as the place of occurrence of the external cause: Secondary | ICD-10-CM | POA: Insufficient documentation

## 2021-05-04 DIAGNOSIS — R0789 Other chest pain: Secondary | ICD-10-CM | POA: Insufficient documentation

## 2021-05-04 MED ORDER — IBUPROFEN 800 MG PO TABS
800.0000 mg | ORAL_TABLET | Freq: Once | ORAL | Status: AC
  Administered 2021-05-04: 800 mg via ORAL
  Filled 2021-05-04: qty 1

## 2021-05-04 MED ORDER — ACETAMINOPHEN 500 MG PO TABS
1000.0000 mg | ORAL_TABLET | Freq: Once | ORAL | Status: AC
  Administered 2021-05-04: 1000 mg via ORAL
  Filled 2021-05-04: qty 2

## 2021-05-04 MED ORDER — OXYCODONE HCL 5 MG PO TABS
5.0000 mg | ORAL_TABLET | Freq: Once | ORAL | Status: AC
  Administered 2021-05-04: 5 mg via ORAL
  Filled 2021-05-04: qty 1
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
## 2021-05-04 NOTE — ED Notes (Signed)
ED Provider at bedside. 

## 2021-05-04 NOTE — Discharge Instructions (Signed)
Your x-ray did not show any acute injury to your chest.  Please follow-up with your doctor in the office.  You will hurt worse tomorrow this is normal.  Please return for worsening chest discomfort difficulty breathing severe abdominal discomfort confusion or vomiting.  Take 4 over the counter ibuprofen tablets 3 times a day or 2 over-the-counter naproxen tablets twice a day for pain. Also take tylenol 1000mg (2 extra strength) four times a day.
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
## 2021-05-04 NOTE — ED Notes (Signed)
Safe transport arranged for pt, per her request. D/c paperwork reviewed with pt. No questions or concerns at time of d/c. Ambulatory to ED exit.

## 2023-04-08 ENCOUNTER — Emergency Department: Payer: No Typology Code available for payment source

## 2023-04-08 ENCOUNTER — Emergency Department
Admission: EM | Admit: 2023-04-08 | Discharge: 2023-04-08 | Disposition: A | Discharge status: Home / Self Care | Payer: No Typology Code available for payment source | Attending: Student in an Organized Health Care Education/Training Program | Admitting: Student in an Organized Health Care Education/Training Program

## 2023-04-08 ENCOUNTER — Other Ambulatory Visit: Payer: Self-pay

## 2023-04-08 DIAGNOSIS — Y92410 Unspecified street and highway as the place of occurrence of the external cause: Secondary | ICD-10-CM | POA: Insufficient documentation

## 2023-04-08 DIAGNOSIS — M791 Myalgia, unspecified site: Secondary | ICD-10-CM | POA: Insufficient documentation

## 2023-04-08 DIAGNOSIS — M7918 Myalgia, other site: Secondary | ICD-10-CM

## 2023-04-08 DIAGNOSIS — M545 Low back pain, unspecified: Secondary | ICD-10-CM | POA: Diagnosis not present

## 2023-04-08 DIAGNOSIS — M546 Pain in thoracic spine: Secondary | ICD-10-CM | POA: Diagnosis not present

## 2023-04-08 MED ORDER — ACETAMINOPHEN 325 MG PO TABS
650.0000 mg | ORAL_TABLET | Freq: Once | ORAL | Status: AC
  Administered 2023-04-08: 650 mg via ORAL
  Filled 2023-04-08: qty 2

## 2023-04-08 MED ORDER — LIDOCAINE 5 % EX PTCH
1.0000 | MEDICATED_PATCH | CUTANEOUS | Status: DC
  Administered 2023-04-08: 1 via TRANSDERMAL
  Filled 2023-04-08: qty 1

## 2023-04-08 NOTE — Discharge Instructions (Signed)
Your CT scan was normal.  You may take Tylenol/ibuprofen per package instructions to help with your symptoms.  It was a pleasure caring for you today.
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
